# Patient Record
Sex: Male | Born: 1955 | Race: Black or African American | Hispanic: No | Marital: Married | Smoking: Never smoker
Health system: Southern US, Community
[De-identification: ages and names within clinical notes are randomized; demographics above are authoritative.]

## PROBLEM LIST (undated history)

## (undated) DIAGNOSIS — I10 Essential (primary) hypertension: Secondary | ICD-10-CM

## (undated) DIAGNOSIS — E119 Type 2 diabetes mellitus without complications: Secondary | ICD-10-CM

## (undated) DIAGNOSIS — J45909 Unspecified asthma, uncomplicated: Secondary | ICD-10-CM

---

## 2001-12-21 ENCOUNTER — Ambulatory Visit (HOSPITAL_COMMUNITY): Admission: RE | Admit: 2001-12-21 | Discharge: 2001-12-21 | Payer: Self-pay | Admitting: *Deleted

## 2001-12-21 ENCOUNTER — Encounter: Payer: Self-pay | Admitting: *Deleted

## 2003-06-01 ENCOUNTER — Ambulatory Visit (HOSPITAL_COMMUNITY): Admission: RE | Admit: 2003-06-01 | Discharge: 2003-06-01 | Payer: Self-pay | Admitting: Family Medicine

## 2003-06-12 ENCOUNTER — Ambulatory Visit (HOSPITAL_COMMUNITY): Admission: RE | Admit: 2003-06-12 | Discharge: 2003-06-12 | Payer: Self-pay | Admitting: Family Medicine

## 2006-08-03 ENCOUNTER — Encounter: Admission: RE | Admit: 2006-08-03 | Discharge: 2006-08-03 | Payer: Self-pay | Admitting: Family Medicine

## 2007-05-29 ENCOUNTER — Encounter: Admission: RE | Admit: 2007-05-29 | Discharge: 2007-05-29 | Payer: Self-pay | Admitting: Neurological Surgery

## 2008-01-02 ENCOUNTER — Emergency Department (HOSPITAL_COMMUNITY): Admission: EM | Admit: 2008-01-02 | Discharge: 2008-01-02 | Payer: Self-pay | Admitting: Emergency Medicine

## 2010-05-17 ENCOUNTER — Other Ambulatory Visit: Payer: Self-pay | Admitting: Family Medicine

## 2010-05-17 DIAGNOSIS — M545 Low back pain: Secondary | ICD-10-CM

## 2010-05-23 ENCOUNTER — Ambulatory Visit
Admission: RE | Admit: 2010-05-23 | Discharge: 2010-05-23 | Disposition: A | Discharge status: Home / Self Care | Payer: 59 | Source: Ambulatory Visit | Attending: Family Medicine | Admitting: Family Medicine

## 2010-05-23 DIAGNOSIS — M545 Low back pain: Secondary | ICD-10-CM

## 2010-10-18 LAB — RAPID URINE DRUG SCREEN, HOSP PERFORMED
Amphetamines: NOT DETECTED
Barbiturates: NOT DETECTED
Benzodiazepines: NOT DETECTED
Cocaine: NOT DETECTED
Opiates: NOT DETECTED

## 2016-04-18 DIAGNOSIS — D509 Iron deficiency anemia, unspecified: Secondary | ICD-10-CM | POA: Diagnosis not present

## 2016-04-18 DIAGNOSIS — E119 Type 2 diabetes mellitus without complications: Secondary | ICD-10-CM | POA: Diagnosis not present

## 2016-04-18 DIAGNOSIS — E7800 Pure hypercholesterolemia, unspecified: Secondary | ICD-10-CM | POA: Diagnosis not present

## 2016-04-18 DIAGNOSIS — I10 Essential (primary) hypertension: Secondary | ICD-10-CM | POA: Diagnosis not present

## 2016-04-18 DIAGNOSIS — Z125 Encounter for screening for malignant neoplasm of prostate: Secondary | ICD-10-CM | POA: Diagnosis not present

## 2016-04-18 DIAGNOSIS — Z0000 Encounter for general adult medical examination without abnormal findings: Secondary | ICD-10-CM | POA: Diagnosis not present

## 2016-05-23 DIAGNOSIS — D509 Iron deficiency anemia, unspecified: Secondary | ICD-10-CM | POA: Diagnosis not present

## 2016-07-02 DIAGNOSIS — Z86010 Personal history of colonic polyps: Secondary | ICD-10-CM | POA: Diagnosis not present

## 2016-07-02 DIAGNOSIS — D126 Benign neoplasm of colon, unspecified: Secondary | ICD-10-CM | POA: Diagnosis not present

## 2016-07-07 DIAGNOSIS — D509 Iron deficiency anemia, unspecified: Secondary | ICD-10-CM | POA: Diagnosis not present

## 2016-10-26 ENCOUNTER — Observation Stay (HOSPITAL_BASED_OUTPATIENT_CLINIC_OR_DEPARTMENT_OTHER)
Admission: EM | Admit: 2016-10-26 | Discharge: 2016-10-28 | Disposition: A | Discharge status: Home / Self Care | Payer: 59 | Attending: Internal Medicine | Admitting: Internal Medicine

## 2016-10-26 ENCOUNTER — Emergency Department (HOSPITAL_BASED_OUTPATIENT_CLINIC_OR_DEPARTMENT_OTHER): Payer: 59

## 2016-10-26 ENCOUNTER — Encounter (HOSPITAL_BASED_OUTPATIENT_CLINIC_OR_DEPARTMENT_OTHER): Payer: Self-pay | Admitting: Emergency Medicine

## 2016-10-26 DIAGNOSIS — R0902 Hypoxemia: Secondary | ICD-10-CM | POA: Diagnosis present

## 2016-10-26 DIAGNOSIS — R6510 Systemic inflammatory response syndrome (SIRS) of non-infectious origin without acute organ dysfunction: Secondary | ICD-10-CM | POA: Insufficient documentation

## 2016-10-26 DIAGNOSIS — I129 Hypertensive chronic kidney disease with stage 1 through stage 4 chronic kidney disease, or unspecified chronic kidney disease: Secondary | ICD-10-CM | POA: Insufficient documentation

## 2016-10-26 DIAGNOSIS — E1122 Type 2 diabetes mellitus with diabetic chronic kidney disease: Secondary | ICD-10-CM | POA: Diagnosis not present

## 2016-10-26 DIAGNOSIS — Z87891 Personal history of nicotine dependence: Secondary | ICD-10-CM | POA: Insufficient documentation

## 2016-10-26 DIAGNOSIS — R05 Cough: Secondary | ICD-10-CM | POA: Diagnosis not present

## 2016-10-26 DIAGNOSIS — Z23 Encounter for immunization: Secondary | ICD-10-CM | POA: Diagnosis not present

## 2016-10-26 DIAGNOSIS — E1169 Type 2 diabetes mellitus with other specified complication: Secondary | ICD-10-CM | POA: Diagnosis not present

## 2016-10-26 DIAGNOSIS — Z7982 Long term (current) use of aspirin: Secondary | ICD-10-CM | POA: Insufficient documentation

## 2016-10-26 DIAGNOSIS — R0602 Shortness of breath: Secondary | ICD-10-CM | POA: Diagnosis not present

## 2016-10-26 DIAGNOSIS — Z7984 Long term (current) use of oral hypoglycemic drugs: Secondary | ICD-10-CM | POA: Diagnosis not present

## 2016-10-26 DIAGNOSIS — I4581 Long QT syndrome: Secondary | ICD-10-CM | POA: Insufficient documentation

## 2016-10-26 DIAGNOSIS — Z79899 Other long term (current) drug therapy: Secondary | ICD-10-CM | POA: Insufficient documentation

## 2016-10-26 DIAGNOSIS — I252 Old myocardial infarction: Secondary | ICD-10-CM | POA: Diagnosis not present

## 2016-10-26 DIAGNOSIS — N182 Chronic kidney disease, stage 2 (mild): Secondary | ICD-10-CM | POA: Insufficient documentation

## 2016-10-26 DIAGNOSIS — I2510 Atherosclerotic heart disease of native coronary artery without angina pectoris: Secondary | ICD-10-CM | POA: Insufficient documentation

## 2016-10-26 DIAGNOSIS — I10 Essential (primary) hypertension: Secondary | ICD-10-CM | POA: Diagnosis not present

## 2016-10-26 DIAGNOSIS — R0600 Dyspnea, unspecified: Secondary | ICD-10-CM

## 2016-10-26 DIAGNOSIS — D72829 Elevated white blood cell count, unspecified: Secondary | ICD-10-CM | POA: Insufficient documentation

## 2016-10-26 HISTORY — DX: Essential (primary) hypertension: I10

## 2016-10-26 HISTORY — DX: Unspecified asthma, uncomplicated: J45.909

## 2016-10-26 HISTORY — DX: Type 2 diabetes mellitus without complications: E11.9

## 2016-10-26 LAB — INFLUENZA PANEL BY PCR (TYPE A & B)
Influenza A By PCR: NEGATIVE
Influenza B By PCR: NEGATIVE

## 2016-10-26 LAB — HEPATIC FUNCTION PANEL
ALBUMIN: 3.8 g/dL (ref 3.5–5.0)
ALT: 18 U/L (ref 17–63)
AST: 21 U/L (ref 15–41)
Alkaline Phosphatase: 56 U/L (ref 38–126)
BILIRUBIN DIRECT: 0.1 mg/dL (ref 0.1–0.5)
Indirect Bilirubin: 0.6 mg/dL (ref 0.3–0.9)
Total Bilirubin: 0.7 mg/dL (ref 0.3–1.2)
Total Protein: 7.6 g/dL (ref 6.5–8.1)

## 2016-10-26 LAB — CBC WITH DIFFERENTIAL/PLATELET
BASOS ABS: 0 10*3/uL (ref 0.0–0.1)
BASOS PCT: 0 %
EOS ABS: 0.3 10*3/uL (ref 0.0–0.7)
EOS PCT: 1 %
HEMATOCRIT: 37.6 % — AB (ref 39.0–52.0)
Hemoglobin: 12.2 g/dL — ABNORMAL LOW (ref 13.0–17.0)
LYMPHS PCT: 4 %
Lymphs Abs: 0.9 10*3/uL (ref 0.7–4.0)
MCH: 25.3 pg — ABNORMAL LOW (ref 26.0–34.0)
MCHC: 32.4 g/dL (ref 30.0–36.0)
MCV: 78 fL (ref 78.0–100.0)
MONO ABS: 1.2 10*3/uL — AB (ref 0.1–1.0)
Monocytes Relative: 5 %
Neutro Abs: 19.7 10*3/uL — ABNORMAL HIGH (ref 1.7–7.7)
Neutrophils Relative %: 90 %
Platelets: 269 10*3/uL (ref 150–400)
RBC: 4.82 MIL/uL (ref 4.22–5.81)
RDW: 14.9 % (ref 11.5–15.5)
WBC: 22.1 10*3/uL — AB (ref 4.0–10.5)

## 2016-10-26 LAB — CBC
HCT: 37.4 % — ABNORMAL LOW (ref 39.0–52.0)
Hemoglobin: 12.6 g/dL — ABNORMAL LOW (ref 13.0–17.0)
MCH: 25.9 pg — AB (ref 26.0–34.0)
MCHC: 33.7 g/dL (ref 30.0–36.0)
MCV: 77 fL — AB (ref 78.0–100.0)
PLATELETS: 285 10*3/uL (ref 150–400)
RBC: 4.86 MIL/uL (ref 4.22–5.81)
RDW: 14.6 % (ref 11.5–15.5)
WBC: 27.3 10*3/uL — ABNORMAL HIGH (ref 4.0–10.5)

## 2016-10-26 LAB — BASIC METABOLIC PANEL
Anion gap: 8 (ref 5–15)
BUN: 19 mg/dL (ref 6–20)
CALCIUM: 8.9 mg/dL (ref 8.9–10.3)
CO2: 24 mmol/L (ref 22–32)
Chloride: 104 mmol/L (ref 101–111)
Creatinine, Ser: 1.54 mg/dL — ABNORMAL HIGH (ref 0.61–1.24)
GFR calc Af Amer: 55 mL/min — ABNORMAL LOW (ref >60)
GFR, EST NON AFRICAN AMERICAN: 48 mL/min — AB (ref >60)
GLUCOSE: 142 mg/dL — AB (ref 65–99)
POTASSIUM: 4.2 mmol/L (ref 3.5–5.1)
SODIUM: 136 mmol/L (ref 135–145)

## 2016-10-26 LAB — TROPONIN I

## 2016-10-26 LAB — URINALYSIS, ROUTINE W REFLEX MICROSCOPIC
Bilirubin Urine: NEGATIVE
GLUCOSE, UA: NEGATIVE mg/dL
Hgb urine dipstick: NEGATIVE
Ketones, ur: NEGATIVE mg/dL
Leukocytes, UA: NEGATIVE
Nitrite: NEGATIVE
PH: 6 (ref 5.0–8.0)
PROTEIN: NEGATIVE mg/dL
Specific Gravity, Urine: 1.015 (ref 1.005–1.030)

## 2016-10-26 LAB — I-STAT CG4 LACTIC ACID, ED: Lactic Acid, Venous: 1.68 mmol/L (ref 0.5–1.9)

## 2016-10-26 LAB — BRAIN NATRIURETIC PEPTIDE: B NATRIURETIC PEPTIDE 5: 38.6 pg/mL (ref 0.0–100.0)

## 2016-10-26 LAB — CREATININE, SERUM
Creatinine, Ser: 1.51 mg/dL — ABNORMAL HIGH (ref 0.61–1.24)
GFR calc Af Amer: 57 mL/min — ABNORMAL LOW (ref >60)
GFR calc non Af Amer: 49 mL/min — ABNORMAL LOW (ref >60)

## 2016-10-26 MED ORDER — HYDROCHLOROTHIAZIDE 12.5 MG PO CAPS
12.5000 mg | ORAL_CAPSULE | Freq: Every day | ORAL | Status: DC
  Administered 2016-10-27 – 2016-10-28 (×2): 12.5 mg via ORAL
  Filled 2016-10-26 (×2): qty 1

## 2016-10-26 MED ORDER — ENOXAPARIN SODIUM 40 MG/0.4ML ~~LOC~~ SOLN
40.0000 mg | SUBCUTANEOUS | Status: DC
  Administered 2016-10-26 – 2016-10-27 (×2): 40 mg via SUBCUTANEOUS
  Filled 2016-10-26 (×2): qty 0.4

## 2016-10-26 MED ORDER — PIPERACILLIN-TAZOBACTAM 3.375 G IVPB 30 MIN
3.3750 g | Freq: Once | INTRAVENOUS | Status: AC
  Administered 2016-10-26: 3.375 g via INTRAVENOUS
  Filled 2016-10-26 (×2): qty 50

## 2016-10-26 MED ORDER — PRAVASTATIN SODIUM 40 MG PO TABS
40.0000 mg | ORAL_TABLET | Freq: Every day | ORAL | Status: DC
  Administered 2016-10-26 – 2016-10-27 (×2): 40 mg via ORAL
  Filled 2016-10-26 (×2): qty 1

## 2016-10-26 MED ORDER — PIPERACILLIN-TAZOBACTAM 3.375 G IVPB: 3.3750 g | Freq: Three times a day (TID) | INTRAVENOUS | Status: DC

## 2016-10-26 MED ORDER — FUROSEMIDE 10 MG/ML IJ SOLN
20.0000 mg | Freq: Once | INTRAMUSCULAR | Status: AC
  Administered 2016-10-26: 20 mg via INTRAVENOUS
  Filled 2016-10-26: qty 2

## 2016-10-26 MED ORDER — ASPIRIN EC 81 MG PO TBEC
81.0000 mg | DELAYED_RELEASE_TABLET | Freq: Every day | ORAL | Status: DC
  Administered 2016-10-27 – 2016-10-28 (×2): 81 mg via ORAL
  Filled 2016-10-26 (×2): qty 1

## 2016-10-26 MED ORDER — VANCOMYCIN HCL IN DEXTROSE 750-5 MG/150ML-% IV SOLN
750.0000 mg | Freq: Two times a day (BID) | INTRAVENOUS | Status: DC
  Filled 2016-10-26: qty 150

## 2016-10-26 MED ORDER — ACETAMINOPHEN 500 MG PO TABS
1000.0000 mg | ORAL_TABLET | Freq: Once | ORAL | Status: AC
  Administered 2016-10-26: 1000 mg via ORAL
  Filled 2016-10-26: qty 2

## 2016-10-26 MED ORDER — LOSARTAN POTASSIUM 50 MG PO TABS
100.0000 mg | ORAL_TABLET | Freq: Every day | ORAL | Status: DC
  Administered 2016-10-27 – 2016-10-28 (×2): 100 mg via ORAL
  Filled 2016-10-26 (×2): qty 2

## 2016-10-26 MED ORDER — LOSARTAN POTASSIUM-HCTZ 100-12.5 MG PO TABS: 1.0000 | ORAL_TABLET | Freq: Every day | ORAL | Status: DC

## 2016-10-26 MED ORDER — VANCOMYCIN HCL IN DEXTROSE 1-5 GM/200ML-% IV SOLN
1000.0000 mg | Freq: Once | INTRAVENOUS | Status: AC
  Administered 2016-10-26: 1000 mg via INTRAVENOUS
  Filled 2016-10-26: qty 200

## 2016-10-26 MED ORDER — IOPAMIDOL (ISOVUE-370) INJECTION 76%
100.0000 mL | Freq: Once | INTRAVENOUS | Status: AC | PRN
  Administered 2016-10-26: 100 mL via INTRAVENOUS

## 2016-10-26 NOTE — H&P (Signed)
History and Physical    Philip Reyes OJJ:[id number] DOB: 05-Jun-1955 DOA: 10/26/2016  PCP: System, Provider Not In  Patient coming from: home  I have personally briefly reviewed patient's old medical records in [place]  Chief Complaint: DOE, cough, SOB  HPI: Philip Reyes is a 61 y.o. male with medical history significant of asthma, diabetes, hypertension who is presenting with worsening shortness of breath. He notes that he's had shortness of breath with exertion for about the past 2 weeks. This morning the shortness of breath was worse. He noticed cough productive of phlegm, no blood. As well as shortness of breath. He noted an elevated temperature ( but no true fever).  He denies any nausea, vomiting, chills, sore throat, paroxysmal nocturnal dyspnea, orthopnea, lower extremity swelling.  Travel to [place] in January.  URI in someone he worked with.  ED Course: Lasix, abx, CXR, CTA, EKG.  Plan to admit given desaturation with ambulation.  Review of Systems: As per HPI otherwise 10 point review of systems negative.   Past Medical History:  Diagnosis Date  . Asthma   . Diabetes mellitus without complication ([place])   . Hypertension     History reviewed. No pertinent surgical history.   reports that he has quit smoking. He has never used smokeless tobacco. He reports that he drinks alcohol. He reports that he does not use drugs.  No Known Allergies  No family history on file.   Prior to Admission medications   Medication Sig Start Date End Date Taking? Authorizing Provider  aspirin EC 81 MG tablet Take 81 mg by mouth daily.   Yes [provider]  glipiZIDE (GLUCOTROL XL) 10 MG 24 hr tablet Take 10 mg by mouth every evening. 09/24/16  Yes [provider]  guaiFENesin (MUCINEX) 600 MG 12 hr tablet Take 1,200 mg by mouth 2 (two) times daily.   Yes [provider]  losartan-hydrochlorothiazide (HYZAAR) 100-12.5 MG tablet Take 1 tablet by mouth  daily.   Yes [provider]  pravastatin (PRAVACHOL) 40 MG tablet Take 40 mg by mouth at bedtime.   Yes [provider]  Saxagliptin-Metformin (KOMBIGLYZE XR) 2.05-998 MG TB24 Take 2 tablets by mouth every evening.    Yes [provider]    Physical Exam: Vitals:   10/26/16 1400 10/26/16 1422 10/26/16 1500 10/26/16 1712  BP: 122/76  95/79 128/72  Pulse: 64 67 63 65  Resp: 20 (!) 26 (!) 22 20  Temp:    98.3 F (36.8 C)  TempSrc:    Oral  SpO2: 96% 96% 96% 95%  Weight:    92 kg (202 lb 13.2 oz)  Height:    6\' 2"  (1.88 m)    Constitutional: NAD, calm, comfortable Vitals:   10/26/16 1400 10/26/16 1422 10/26/16 1500 10/26/16 1712  BP: 122/76  95/79 128/72  Pulse: 64 67 63 65  Resp: 20 (!) 26 (!) 22 20  Temp:    98.3 F (36.8 C)  TempSrc:    Oral  SpO2: 96% 96% 96% 95%  Weight:    92 kg (202 lb 13.2 oz)  Height:    6\' 2"  (1.88 m)   Eyes: PERRL, lids and conjunctivae normal ENMT: Mucous membranes are moist. Posterior pharynx clear of any exudate or lesions.Normal dentition.  Neck: normal, supple, no masses, no thyromegaly Respiratory: clear to auscultation bilaterally, no wheezing, no crackles. Normal respiratory effort. No accessory muscle use.  Cardiovascular: Regular rate and rhythm, no murmurs /  rubs / gallops. No extremity edema. 2+ pedal pulses. No carotid bruits.  Abdomen: no tenderness, no masses palpated. No hepatosplenomegaly. Bowel sounds positive.  Musculoskeletal: no clubbing / cyanosis. No joint deformity upper and lower extremities. Good ROM, no contractures. Normal muscle tone.  Skin: no rashes, lesions, ulcers. No induration Neurologic: CN 2-12 grossly intact. Sensation intact, DTR normal. Strength 5/5 in all 4.  Psychiatric: Normal judgment and insight. Alert and oriented x 3. Normal mood.   Labs on Admission: I have personally reviewed following labs and imaging studies  CBC:  Recent Labs Lab 10/26/16 0924 10/26/16 1707  WBC  22.1* 27.3*  NEUTROABS 19.7*  --   HGB 12.2* 12.6*  HCT 37.6* 37.4*  MCV 78.0 77.0*  PLT 269 528   Basic Metabolic Panel:  Recent Labs Lab 10/26/16 0924 10/26/16 1707  NA 136  --   K 4.2  --   CL 104  --   CO2 24  --   GLUCOSE 142*  --   BUN 19  --   CREATININE 1.54* 1.51*  CALCIUM 8.9  --    GFR: Estimated Creatinine Clearance: 61.2 mL/min (A) (by C-G formula based on SCr of 1.51 mg/dL (H)). Liver Function Tests:  Recent Labs Lab 10/26/16 0924  AST 21  ALT 18  ALKPHOS 56  BILITOT 0.7  PROT 7.6  ALBUMIN 3.8   No results for input(s): LIPASE, AMYLASE in the last 168 hours. No results for input(s): AMMONIA in the last 168 hours. Coagulation Profile: No results for input(s): INR, PROTIME in the last 168 hours. Cardiac Enzymes:  Recent Labs Lab 10/26/16 0924  TROPONINI <0.03   BNP (last 3 results) No results for input(s): PROBNP in the last 8760 hours. HbA1C: No results for input(s): HGBA1C in the last 72 hours. CBG: No results for input(s): GLUCAP in the last 168 hours. Lipid Profile: No results for input(s): CHOL, HDL, LDLCALC, TRIG, CHOLHDL, LDLDIRECT in the last 72 hours. Thyroid Function Tests: No results for input(s): TSH, T4TOTAL, FREET4, T3FREE, THYROIDAB in the last 72 hours. Anemia Panel: No results for input(s): VITAMINB12, FOLATE, FERRITIN, TIBC, IRON, RETICCTPCT in the last 72 hours. Urine analysis:    Component Value Date/Time   COLORURINE YELLOW 10/26/2016 [place] 10/26/2016 1158   LABSPEC 1.015 10/26/2016 1158   PHURINE 6.0 10/26/2016 1158   GLUCOSEU NEGATIVE 10/26/2016 1158   HGBUR NEGATIVE 10/26/2016 1158   BILIRUBINUR NEGATIVE 10/26/2016 1158   KETONESUR NEGATIVE 10/26/2016 1158   PROTEINUR NEGATIVE 10/26/2016 1158   NITRITE NEGATIVE 10/26/2016 1158   LEUKOCYTESUR NEGATIVE 10/26/2016 1158    Radiological Exams on Admission: Dg Chest 2 View  Result Date: 10/26/2016 CLINICAL DATA:  Cough and congestion for 2  weeks EXAM: CHEST  2 VIEW COMPARISON:  None available FINDINGS: Normal heart size. Masslike density at the level of the hila on the lateral projection that is not seen frontally. There is no edema, consolidation, effusion, or pneumothorax. No acute osseous finding. IMPRESSION: Masslike appearance overlapping the hila in the lateral projection that is not seen frontally. Recommend chest CT in this former smoker. Electronically Signed   By: Monte Fantasia M.D.   On: 10/26/2016 09:28   Ct Angio Chest Pe W And/or Wo Contrast  Result Date: 10/26/2016 CLINICAL DATA:  Shortness of breath, cough and leukocytosis. Lateral chest x-ray demonstrated potential hilar/perihilar mass-like density. EXAM: CT ANGIOGRAPHY CHEST WITH CONTRAST TECHNIQUE: Multidetector CT imaging of the chest was performed using the standard protocol during bolus  administration of intravenous contrast. Multiplanar CT image reconstructions and MIPs were obtained to evaluate the vascular anatomy. CONTRAST:  100 mL Isovue 370 IV COMPARISON:  Chest x-ray earlier today. FINDINGS: Cardiovascular: Opacification of the pulmonary artery use is somewhat suboptimal at the peripheral level. However, significant pulmonary embolism can be excluded. The thoracic aorta is normal in caliber and shows no evidence of significant atherosclerosis, aneurysm or dissection. Proximal great vessels are normally patent and show normal branching anatomy. There is evidence of coronary atherosclerosis with calcified plaque present at the level of the left main coronary artery, LAD and left circumflex coronary artery. No pericardial fluid. The heart size is normal. Somewhat thin appearance of the lateral left ventricular myocardium and distal apex may be consistent with prior myocardial infarction. Mediastinum/Nodes: No enlarged mediastinal, hilar, or axillary lymph nodes. Thyroid gland, trachea, and esophagus demonstrate no significant findings. Lungs/Pleura: There is no  evidence of pulmonary edema, consolidation, pneumothorax, nodule or pleural fluid. Upper Abdomen: No acute abnormality. Musculoskeletal: No chest wall abnormality. No acute or significant osseous findings. Review of the MIP images confirms the above findings. IMPRESSION: 1. No evidence of pulmonary embolism or other acute pulmonary process. 2. Coronary atherosclerosis with calcified plaque at the level of the left main, LAD and left circumflex territories. Associated suggestion by CT of lateral myocardial and distal apical thinning which may be consistent with prior infarction. Correlation suggested with any known prior cardiac history. 3. No pulmonary masses or lymphadenopathy. Electronically Signed   By: Aletta Edouard M.D.   On: 10/26/2016 10:44    EKG: Independently reviewed. No priors for comparison.  Sinus rhythm. Q waves in aVR, aVL, V1, V2.  Prolonged QT.   Assessment/Plan Active Problems:   Hypoxia  Systemic inflammatory response syndrome  Leukocytosis:  With tachypnea and elevated white blood cell count in the emergency department.elevated white blood cell count suggests infection, but chest imaging not revealing of this.  received vancomycin and Zosyn in the ED.  Possibly viral, but impressive white blood cell count. Urine not suggestive of infection. [ ]  RVP, influenza [ ]  blood cultures. Will hold off on continued antibiotics at this point without a clear source   [ ]  daily CBC  Cough  SOB with exertion:  Symptoms sound like a respiratory infection with cough, shortness of breath, elevated white blood cell count. Imaging is unrevealing.  Negative proBNP and troponin.  He's been on room air since presentation, but was tachypneic in the ED and desaturated with ambulation in the emergency department so he was transferred here.  CTA showed no evidence of PE or acute pulmonary process (it did show evidence of possible prior MI).  He's had sick contact in coworker, travel to [place] in Jan.   At the time of my evaluation he was feeling better and breathing comfortable.  [ ]  RVP, influenza [ ]  echo [ ]  6 min walk for tomorrow Holding abx for now [ ]  case discussed with pulm by ED who per Dr. Keturah Shavers note will "c/s when he is inpatient after additional workup"  Coronary atherosclerosis and suggestion of lateral myocardial and distal apical thickening on CT:  No CP.  [ ]  echo  CKD stage IV: unclear baseline, ctm T2DM: hold oral meds.  SSI.  HTN: continue home meds  DVT prophylaxis: lovenox  Code Status: full  Family Communication: family in room Disposition Plan: home Consults called: pulmonology by ED  Admission status: telemetry    Fayrene Helper MD [facility] [phone]  If 7PM-7AM, please contact night-coverage [email or web address] Password [facility]  10/26/2016, 8:42 PM

## 2016-10-26 NOTE — ED Triage Notes (Signed)
Cough, body aches x 1 week. SOB when laying down.

## 2016-10-26 NOTE — ED Notes (Signed)
Patient transported to CT 

## 2016-10-26 NOTE — ED Notes (Signed)
Resting HR 78, RR 34, SpO2 96%.  After ambulating about 50 ft here in the ED  HR increased to 111, RR remained about the same and SpO2 decreased to 87%. Pt states "I dont feel any different."   Sp02 increased to 97% and we made another lap here in the ED. HR increased to 115, RR remained the same. SpO2 decreased to 87%. PA made aware.

## 2016-10-26 NOTE — ED Notes (Signed)
Pt on cardiac monitor and auto VS 

## 2016-10-26 NOTE — ED Notes (Signed)
Attempted to call report x1, advised nurse was at lunch.

## 2016-10-26 NOTE — ED Notes (Signed)
Patient transported to X-ray 

## 2016-10-26 NOTE — ED Provider Notes (Signed)
[facility] Provider Note   CSN: [id number] Arrival date & time: 10/26/16  0900     History   Chief Complaint Chief Complaint  Patient presents with  . Cough    HPI Philip Reyes is a 61 y.o. male with a pmh of DM, Asthma, HTN, and former smoker. He presents wit the ED with cc of SOB . Onset 1 week ago. Patient states that he is dyspneic at rest and with exertion. He feeling more short of breath but denies any wheezing, fevers or chills. Last night his breathing became very labored especially when trying to lying flat. He denies a history of CHF, any recent chest pain. He has noticed a cough which is now painful. He also felt febrile this morning with body aches, chills. He states that his blood sugars have been running between 80 and 110 and his last hemoglobin A1c was 6.2.  HPI  Past Medical History:  Diagnosis Date  . Asthma   . Diabetes mellitus without complication ([place])   . Hypertension     There are no active problems to display for this patient.   History reviewed. No pertinent surgical history.     Home Medications    Prior to Admission medications   Medication Sig Start Date End Date Taking? Authorizing Provider  glipiZIDE (GLUCOTROL) 10 MG tablet Take 10 mg by mouth daily before breakfast.   Yes [provider]  losartan-hydrochlorothiazide (HYZAAR) 100-12.5 MG tablet Take 1 tablet by mouth daily.   Yes [provider]  Saxagliptin-Metformin (KOMBIGLYZE XR) 2.05-998 MG TB24 Take by mouth.   Yes [provider]    Family History No family history on file.  Social History Social History  Substance Use Topics  . Smoking status: Former Research scientist (life sciences)  . Smokeless tobacco: Never Used  . Alcohol use Yes     Allergies   Patient has no known allergies.   Review of Systems Review of Systems  Ten systems reviewed and are negative for acute change, except as noted in the HPI.  Physical Exam Updated Vital Signs BP 140/89  (BP Location: Right Arm)   Pulse 87   Temp 99.4 F (37.4 C) (Oral)   Resp (!) 28   Ht 6\' 3"  (1.905 m)   Wt 95.3 kg (210 lb)   SpO2 95%   BMI 26.25 kg/m   Physical Exam  Constitutional: He appears well-developed and well-nourished. No distress.  HENT:  Head: Normocephalic and atraumatic.  Eyes: Conjunctivae are normal. No scleral icterus.  Neck: Normal range of motion. Neck supple.  Cardiovascular: Normal rate, regular rhythm, normal heart sounds and intact distal pulses.   Trace pitting edema on bilateral legs.  Pulmonary/Chest: Effort normal and breath sounds normal. No respiratory distress.  Tachypnea, course crackles, worse in the lung bases  Abdominal: Soft. He exhibits no distension. There is no tenderness.  Musculoskeletal: He exhibits no edema.  Neurological: He is alert.  Skin: Skin is warm and dry. He is not diaphoretic.  Psychiatric: He has a normal mood and affect. His behavior is normal.  Nursing note and vitals reviewed.    ED Treatments / Results  Labs (all labs ordered are listed, but only abnormal results are displayed) Labs Reviewed  BASIC METABOLIC PANEL  CBC WITH DIFFERENTIAL/PLATELET  BRAIN NATRIURETIC PEPTIDE  TROPONIN I    EKG  EKG Interpretation None       Radiology Dg Chest 2 View  Result Date: 10/26/2016 CLINICAL DATA:  Cough and congestion  for 2 weeks EXAM: CHEST  2 VIEW COMPARISON:  None available FINDINGS: Normal heart size. Masslike density at the level of the hila on the lateral projection that is not seen frontally. There is no edema, consolidation, effusion, or pneumothorax. No acute osseous finding. IMPRESSION: Masslike appearance overlapping the hila in the lateral projection that is not seen frontally. Recommend chest CT in this former smoker. Electronically Signed   By: Monte Fantasia M.D.   On: 10/26/2016 09:28    Procedures Procedures (including critical care time)  Medications Ordered in ED Medications  acetaminophen  (TYLENOL) tablet 1,000 mg (not administered)     Initial Impression / Assessment and Plan / ED Course  I have reviewed the triage vital signs and the nursing notes.  Pertinent labs & imaging results that were available during my care of the patient were reviewed by me and considered in my medical decision making (see chart for details).  Clinical Course as of Oct 27 1651  Sun Oct 26, 2016  1348 Patient with exertional hypoxia, orthopnea, CAD on CT scan.   [AH]    Clinical Course User Index [AH] Margarita Mail, PA-C    Patient with exertional hypoxia, persistent tachypnea who presented with new onset dyspnea on exertion and orthopnea. His chest x-ray showed mass on the lateral film however this appears to be artifact. No evidence of PE or masses on the patient's CT angiogram. No evidence of fluid in the lungs. Dr. Kathlene Cote and I discussed these findings. The patient pears to have significant atherosclerotic coronary arteriesinto any of the apical wall suggestive of previous MIs. 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 Given his exertional hypoxia, and difficulty breathing for the patient will need admission with further workup to include echocardiogram. I believe the etiology is likely cardiac and not pulmonary Differential diagnosis does include idiopathic pulmonary hypertension, cor pulmonale, CHF is less likely given the fact that there is no pulmonary edema. No evidence of pulmonary embolus, masses. No active chest pain or elevation in troponin or EKG changes suggestive of ACS. The patient will be admitted to the hospital for further workup. He is stable throughout his visit. Seen in shared visit with Dr. Oleta Mouse who agrees with workup and plan for admission.  Final Clinical Impressions(s) / ED Diagnoses   Final diagnoses:  Dyspnea, unspecified type  Hypoxia  Coronary artery disease involving native heart without angina pectoris, unspecified vessel or lesion type    New  Prescriptions New Prescriptions   No medications on file     Margarita Mail, PA-C 10/26/16 1658    Forde Dandy, MD 10/26/16 1714

## 2016-10-26 NOTE — Progress Notes (Signed)
Pharmacy Antibiotic Note  Philip Reyes is a 62 y.o. male admitted on 10/26/2016 with sepsis.  Pharmacy has been consulted for Zosyn and vancomycin dosing.  Given one time doses by ED provider. SCr 1.54, CrCl ~9ml/min  Plan: Continue Zosyn 3.375 gm IV q8h (4 hour infusion) Start vancomycin 750mg  IV Q12h Monitor clinical picture, renal function, VT prn F/U C&S, abx deescalation / LOT   Height: 6\' 3"  (190.5 cm) Weight: 210 lb (95.3 kg) IBW/kg (Calculated) : 84.5  Temp (24hrs), Avg:99.4 F (37.4 C), Min:99.4 F (37.4 C), Max:99.4 F (37.4 C)   Recent Labs Lab 10/26/16 0924  WBC 22.1*    CrCl cannot be calculated (No order found.).    No Known Allergies  Thank you for allowing pharmacy to be a part of this patient's care.  Reginia Naas 10/26/2016 9:52 AM

## 2016-10-27 ENCOUNTER — Observation Stay (HOSPITAL_BASED_OUTPATIENT_CLINIC_OR_DEPARTMENT_OTHER): Payer: 59

## 2016-10-27 DIAGNOSIS — I361 Nonrheumatic tricuspid (valve) insufficiency: Secondary | ICD-10-CM | POA: Diagnosis not present

## 2016-10-27 DIAGNOSIS — R0902 Hypoxemia: Secondary | ICD-10-CM | POA: Diagnosis not present

## 2016-10-27 DIAGNOSIS — I2510 Atherosclerotic heart disease of native coronary artery without angina pectoris: Secondary | ICD-10-CM

## 2016-10-27 DIAGNOSIS — R0600 Dyspnea, unspecified: Secondary | ICD-10-CM | POA: Diagnosis not present

## 2016-10-27 LAB — CBC WITH DIFFERENTIAL/PLATELET
BASOS ABS: 0 10*3/uL (ref 0.0–0.1)
BASOS PCT: 0 %
Eosinophils Absolute: 0.6 10*3/uL (ref 0.0–0.7)
Eosinophils Relative: 3 %
HEMATOCRIT: 37.7 % — AB (ref 39.0–52.0)
HEMOGLOBIN: 12.5 g/dL — AB (ref 13.0–17.0)
LYMPHS ABS: 2 10*3/uL (ref 0.7–4.0)
LYMPHS PCT: 11 %
MCH: 25.7 pg — ABNORMAL LOW (ref 26.0–34.0)
MCHC: 33.2 g/dL (ref 30.0–36.0)
MCV: 77.6 fL — AB (ref 78.0–100.0)
MONO ABS: 0.6 10*3/uL (ref 0.1–1.0)
Monocytes Relative: 3 %
Neutro Abs: 15.4 10*3/uL — ABNORMAL HIGH (ref 1.7–7.7)
Neutrophils Relative %: 83 %
Platelets: 276 10*3/uL (ref 150–400)
RBC: 4.86 MIL/uL (ref 4.22–5.81)
RDW: 14.6 % (ref 11.5–15.5)
WBC: 18.5 10*3/uL — ABNORMAL HIGH (ref 4.0–10.5)

## 2016-10-27 LAB — RESPIRATORY PANEL BY PCR

## 2016-10-27 LAB — BASIC METABOLIC PANEL
ANION GAP: 11 (ref 5–15)
BUN: 18 mg/dL (ref 6–20)
CHLORIDE: 100 mmol/L — AB (ref 101–111)
CO2: 26 mmol/L (ref 22–32)
Calcium: 9 mg/dL (ref 8.9–10.3)
Creatinine, Ser: 1.47 mg/dL — ABNORMAL HIGH (ref 0.61–1.24)
GFR calc non Af Amer: 50 mL/min — ABNORMAL LOW (ref >60)
GFR, EST AFRICAN AMERICAN: 59 mL/min — AB (ref >60)
Glucose, Bld: 150 mg/dL — ABNORMAL HIGH (ref 65–99)
POTASSIUM: 3.8 mmol/L (ref 3.5–5.1)
Sodium: 137 mmol/L (ref 135–145)

## 2016-10-27 LAB — HIV ANTIBODY (ROUTINE TESTING W REFLEX): HIV SCREEN 4TH GENERATION: NONREACTIVE

## 2016-10-27 LAB — ECHOCARDIOGRAM COMPLETE
Height: 74 in
Weight: 3245.17 oz

## 2016-10-27 MED ORDER — FUROSEMIDE 10 MG/ML IJ SOLN
40.0000 mg | Freq: Once | INTRAMUSCULAR | Status: AC
Start: 2016-10-27 — End: 2016-10-27
  Administered 2016-10-27: 40 mg via INTRAVENOUS
  Filled 2016-10-27: qty 4

## 2016-10-27 MED ORDER — INFLUENZA VAC SPLIT QUAD 0.5 ML IM SUSY
0.5000 mL | PREFILLED_SYRINGE | INTRAMUSCULAR | Status: AC
Start: 2016-10-28 — End: 2016-10-28
  Administered 2016-10-28: 0.5 mL via INTRAMUSCULAR
  Filled 2016-10-27: qty 0.5

## 2016-10-27 NOTE — Progress Notes (Signed)
PROGRESS NOTE    THOMA PAULSEN  CHY:[id number] DOB: 1955/01/22 DOA: 10/26/2016 PCP: System, Provider Not In    Brief Narrative: Philip Reyes is a 61 y.o. male with medical history significant of asthma, diabetes, hypertension who is presenting with worsening shortness of breath. He is being admitted for evaluation of SOB.   Assessment & Plan:   Active Problems:   Hypoxia   Cough and SOB:  Probably viral in nature.  Improved.  Echocardiogram reviewed and does not show HF.  CTA does not show evidence of PE.  One dose of lasix ordered and his sob improved.     SIRS:  LEUKOCYTOSIS:  Probably viral in nature.  resp panel is negative.  Holding antibiotics for now.       DVT prophylaxis: LOVENOX.  Code Status: full code.  Family Communication: none at bedside.  Disposition Plan: possibly home in am.   Consultants:   None.   Procedures: echocardiogram.  Antimicrobials: none.   Subjective: No new complaints.   Objective: Vitals:   10/26/16 1712 10/26/16 2101 10/27/16 0451 10/27/16 1401  BP: 128/72 135/73 121/68 126/79  Pulse: 65 66 62 (!) 57  Resp: 20 20 20 18   Temp: 98.3 F (36.8 C) 98.5 F (36.9 C) 98.4 F (36.9 C) 98.9 F (37.2 C)  TempSrc: Oral Oral Oral Oral  SpO2: 95% 98% 93% 99%  Weight: 92 kg (202 lb 13.2 oz)     Height: 6\' 2"  (1.88 m)       Intake/Output Summary (Last 24 hours) at 10/27/16 1717 Last data filed at 10/27/16 1400  Gross per 24 hour  Intake              240 ml  Output                0 ml  Net              240 ml   Filed Weights   10/26/16 0906 10/26/16 1712  Weight: 95.3 kg (210 lb) 92 kg (202 lb 13.2 oz)    Examination:  General exam: Appears calm and comfortable  Respiratory system: Clear to auscultation. Respiratory effort normal. Cardiovascular system: S1 & S2 heard, RRR. No JVD, murmurs, rubs, gallops or clicks. No pedal edema. Gastrointestinal system: Abdomen is nondistended, soft and nontender. No organomegaly or  masses felt. Normal bowel sounds heard. Central nervous system: Alert and oriented. No focal neurological deficits. Extremities: Symmetric 5 x 5 power. Skin: No rashes, lesions or ulcers Psychiatry: Judgement and insight appear normal. Mood & affect appropriate.     Data Reviewed: I have personally reviewed following labs and imaging studies  CBC:  Recent Labs Lab 10/26/16 0924 10/26/16 1707 10/27/16 0535  WBC 22.1* 27.3* 18.5*  NEUTROABS 19.7*  --  15.4*  HGB 12.2* 12.6* 12.5*  HCT 37.6* 37.4* 37.7*  MCV 78.0 77.0* 77.6*  PLT 269 285 878   Basic Metabolic Panel:  Recent Labs Lab 10/26/16 0924 10/26/16 1707 10/27/16 0535  NA 136  --  137  K 4.2  --  3.8  CL 104  --  100*  CO2 24  --  26  GLUCOSE 142*  --  150*  BUN 19  --  18  CREATININE 1.54* 1.51* 1.47*  CALCIUM 8.9  --  9.0   GFR: Estimated Creatinine Clearance: 62.9 mL/min (A) (by C-G formula based on SCr of 1.47 mg/dL (H)). Liver Function Tests:  Recent Labs Lab 10/26/16 0924  AST 21  ALT 18  ALKPHOS 56  BILITOT 0.7  PROT 7.6  ALBUMIN 3.8   No results for input(s): LIPASE, AMYLASE in the last 168 hours. No results for input(s): AMMONIA in the last 168 hours. Coagulation Profile: No results for input(s): INR, PROTIME in the last 168 hours. Cardiac Enzymes:  Recent Labs Lab 10/26/16 0924  TROPONINI <0.03   BNP (last 3 results) No results for input(s): PROBNP in the last 8760 hours. HbA1C: No results for input(s): HGBA1C in the last 72 hours. CBG: No results for input(s): GLUCAP in the last 168 hours. Lipid Profile: No results for input(s): CHOL, HDL, LDLCALC, TRIG, CHOLHDL, LDLDIRECT in the last 72 hours. Thyroid Function Tests: No results for input(s): TSH, T4TOTAL, FREET4, T3FREE, THYROIDAB in the last 72 hours. Anemia Panel: No results for input(s): VITAMINB12, FOLATE, FERRITIN, TIBC, IRON, RETICCTPCT in the last 72 hours. Sepsis Labs:  Recent Labs Lab 10/26/16 1003  LATICACIDVEN  1.68    Recent Results (from the past 240 hour(s))  Blood Culture (routine x 2)     Status: None (Preliminary result)   Collection Time: 10/26/16  9:55 AM  Result Value Ref Range Status   Specimen Description BLOOD RIGHT ARM  Final   Special Requests   Final    BOTTLES DRAWN AEROBIC AND ANAEROBIC Blood Culture adequate volume   Culture   Final    NO GROWTH < 24 HOURS Performed at [facility], [place] [address], [place], [place] [address]    Report Status PENDING  Incomplete  Blood Culture (routine x 2)     Status: None (Preliminary result)   Collection Time: 10/26/16  9:57 AM  Result Value Ref Range Status   Specimen Description BLOOD LEFT HAND  Final   Special Requests   Final    BOTTLES DRAWN AEROBIC AND ANAEROBIC Blood Culture adequate volume   Culture   Final    NO GROWTH < 24 HOURS Performed at [facility], [place] [address], [place], [place] [address]    Report Status PENDING  Incomplete  Respiratory Panel by PCR     Status: None   Collection Time: 10/26/16  5:44 PM  Result Value Ref Range Status   Adenovirus NOT DETECTED NOT DETECTED Final   Coronavirus 229E NOT DETECTED NOT DETECTED Final   Coronavirus HKU1 NOT DETECTED NOT DETECTED Final   Coronavirus NL63 NOT DETECTED NOT DETECTED Final   Coronavirus OC43 NOT DETECTED NOT DETECTED Final   Metapneumovirus NOT DETECTED NOT DETECTED Final   Rhinovirus / Enterovirus NOT DETECTED NOT DETECTED Final   Influenza A NOT DETECTED NOT DETECTED Final   Influenza B NOT DETECTED NOT DETECTED Final   Parainfluenza Virus 1 NOT DETECTED NOT DETECTED Final   Parainfluenza Virus 2 NOT DETECTED NOT DETECTED Final   Parainfluenza Virus 3 NOT DETECTED NOT DETECTED Final   Parainfluenza Virus 4 NOT DETECTED NOT DETECTED Final   Respiratory Syncytial Virus NOT DETECTED NOT DETECTED Final   Bordetella pertussis NOT DETECTED NOT DETECTED Final   Chlamydophila pneumoniae NOT DETECTED NOT DETECTED Final   Mycoplasma pneumoniae NOT  DETECTED NOT DETECTED Final    Comment: Performed at [facility], [place] [address] [place], [place] [address]         Radiology Studies: Dg Chest 2 View  Result Date: 10/26/2016 CLINICAL DATA:  Cough and congestion for 2 weeks EXAM: CHEST  2 VIEW COMPARISON:  None available FINDINGS: Normal heart size. Masslike density at the level of  the hila on the lateral projection that is not seen frontally. There is no edema, consolidation, effusion, or pneumothorax. No acute osseous finding. IMPRESSION: Masslike appearance overlapping the hila in the lateral projection that is not seen frontally. Recommend chest CT in this former smoker. Electronically Signed   By: Monte Fantasia M.D.   On: 10/26/2016 09:28   Ct Angio Chest Pe W And/or Wo Contrast  Result Date: 10/26/2016 CLINICAL DATA:  Shortness of breath, cough and leukocytosis. Lateral chest x-ray demonstrated potential hilar/perihilar mass-like density. EXAM: CT ANGIOGRAPHY CHEST WITH CONTRAST TECHNIQUE: Multidetector CT imaging of the chest was performed using the standard protocol during bolus administration of intravenous contrast. Multiplanar CT image reconstructions and MIPs were obtained to evaluate the vascular anatomy. CONTRAST:  100 mL Isovue 370 IV COMPARISON:  Chest x-ray earlier today. FINDINGS: Cardiovascular: Opacification of the pulmonary artery use is somewhat suboptimal at the peripheral level. However, significant pulmonary embolism can be excluded. The thoracic aorta is normal in caliber and shows no evidence of significant atherosclerosis, aneurysm or dissection. Proximal great vessels are normally patent and show normal branching anatomy. There is evidence of coronary atherosclerosis with calcified plaque present at the level of the left main coronary artery, LAD and left circumflex coronary artery. No pericardial fluid. The heart size is normal. Somewhat thin appearance of the lateral left ventricular myocardium and distal  apex may be consistent with prior myocardial infarction. Mediastinum/Nodes: No enlarged mediastinal, hilar, or axillary lymph nodes. Thyroid gland, trachea, and esophagus demonstrate no significant findings. Lungs/Pleura: There is no evidence of pulmonary edema, consolidation, pneumothorax, nodule or pleural fluid. Upper Abdomen: No acute abnormality. Musculoskeletal: No chest wall abnormality. No acute or significant osseous findings. Review of the MIP images confirms the above findings. IMPRESSION: 1. No evidence of pulmonary embolism or other acute pulmonary process. 2. Coronary atherosclerosis with calcified plaque at the level of the left main, LAD and left circumflex territories. Associated suggestion by CT of lateral myocardial and distal apical thinning which may be consistent with prior infarction. Correlation suggested with any known prior cardiac history. 3. No pulmonary masses or lymphadenopathy. Electronically Signed   By: Aletta Edouard M.D.   On: 10/26/2016 10:44        Scheduled Meds: . aspirin EC  81 mg Oral Daily  . enoxaparin (LOVENOX) injection  40 mg Subcutaneous Q24H  . losartan  100 mg Oral Daily   And  . hydrochlorothiazide  12.5 mg Oral Daily  . [START ON 10/28/2016] Influenza vac split quadrivalent PF  0.5 mL Intramuscular Tomorrow-1000  . pravastatin  40 mg Oral QHS   Continuous Infusions:   LOS: 1 day    Time spent: 35 minutes.     Hosie Poisson, MD [facility] Pager [phone]  If 7PM-7AM, please contact night-coverage [email or web address] Password [email or web address] 10/27/2016, 5:17 PM

## 2016-10-27 NOTE — Progress Notes (Signed)
2D Echocardiogram has been performed.  Philip Reyes 10/27/2016, 12:05 PM

## 2016-10-28 DIAGNOSIS — R0600 Dyspnea, unspecified: Secondary | ICD-10-CM | POA: Diagnosis not present

## 2016-10-28 DIAGNOSIS — R0902 Hypoxemia: Secondary | ICD-10-CM | POA: Diagnosis not present

## 2016-10-28 LAB — CBC WITH DIFFERENTIAL/PLATELET
Basophils Absolute: 0 10*3/uL (ref 0.0–0.1)
Basophils Relative: 0 %
Eosinophils Absolute: 0.4 10*3/uL (ref 0.0–0.7)
Eosinophils Relative: 5 %
HEMATOCRIT: 39.8 % (ref 39.0–52.0)
HEMOGLOBIN: 13.5 g/dL (ref 13.0–17.0)
LYMPHS ABS: 1.5 10*3/uL (ref 0.7–4.0)
LYMPHS PCT: 18 %
MCH: 26.3 pg (ref 26.0–34.0)
MCHC: 33.9 g/dL (ref 30.0–36.0)
MCV: 77.6 fL — ABNORMAL LOW (ref 78.0–100.0)
MONOS PCT: 7 %
Monocytes Absolute: 0.5 10*3/uL (ref 0.1–1.0)
NEUTROS ABS: 5.6 10*3/uL (ref 1.7–7.7)
NEUTROS PCT: 70 %
Platelets: 265 10*3/uL (ref 150–400)
RBC: 5.13 MIL/uL (ref 4.22–5.81)
RDW: 14.3 % (ref 11.5–15.5)
WBC: 8.1 10*3/uL (ref 4.0–10.5)

## 2016-10-28 LAB — BASIC METABOLIC PANEL
Anion gap: 11 (ref 5–15)
BUN: 20 mg/dL (ref 6–20)
CHLORIDE: 96 mmol/L — AB (ref 101–111)
CO2: 27 mmol/L (ref 22–32)
CREATININE: 1.51 mg/dL — AB (ref 0.61–1.24)
Calcium: 9.1 mg/dL (ref 8.9–10.3)
GFR calc non Af Amer: 49 mL/min — ABNORMAL LOW (ref >60)
GFR, EST AFRICAN AMERICAN: 57 mL/min — AB (ref >60)
Glucose, Bld: 303 mg/dL — ABNORMAL HIGH (ref 65–99)
POTASSIUM: 4.1 mmol/L (ref 3.5–5.1)
Sodium: 134 mmol/L — ABNORMAL LOW (ref 135–145)

## 2016-10-28 NOTE — Progress Notes (Signed)
Patient given discharge instructions, and verbalized an understanding of all discharge instructions.  Patient agrees with discharge plan, and is being discharged in stable medical condition.  Patient assisted to front door by nurse tech.

## 2016-10-28 NOTE — Care Management Note (Signed)
Case Management Note  Patient Details  Name: JOSEF TOURIGNY MRN: [id number] Date of Birth: 05-19-1955  Subjective/Objective:                    Action/Plan:d/c home.   Expected Discharge Date:  10/29/16               Expected Discharge Plan:  Home/Self Care  In-House Referral:     Discharge planning Services  CM Consult  Post Acute Care Choice:    Choice offered to:     DME Arranged:    DME Agency:     HH Arranged:    HH Agency:     Status of Service:  Completed, signed off  If discussed at [employer] of Stay Meetings, dates discussed:    Additional Comments:  Dessa Phi, RN 10/28/2016, 9:29 AM

## 2016-10-31 LAB — CULTURE, BLOOD (ROUTINE X 2)
Culture: NO GROWTH
Culture: NO GROWTH
SPECIAL REQUESTS: ADEQUATE
Special Requests: ADEQUATE

## 2016-11-05 NOTE — Discharge Summary (Signed)
Physician Discharge Summary  Philip Reyes:[id number] DOB: Feb 02, 1955 DOA: 10/26/2016  PCP: System, Provider Not In  Admit date: 10/26/2016 Discharge date: 10/28/2016  Admitted From: Home.  Disposition:  Home.   Recommendations for Outpatient Follow-up:  1. Follow up with PCP in 1-2 weeks 2. Please obtain BMP/CBC in one week 3. Please follow up with cardiology, if recurrent symptoms of sob occur.    Discharge Condition:stable.  CODE STATUS: full code.  Diet recommendation: Heart Healthy  Brief/Interim Summary: Philip Evetts Parkeris a 61 y.o.malewith medical history significant of asthma, diabetes, hypertension who is presenting with worsening shortness of breath. He is being admitted for evaluation of SOB.   Discharge Diagnoses:  Active Problems:   Hypoxia  Cough and SOB:  Probably viral in nature.  Improved.  Echocardiogram reviewed and does not show HF.  CTA does not show evidence of PE.  One dose of lasix ordered and his sob improved.  Discharge home and if symptoms reoccur, to follow up with cardiology.     SIRS:  LEUKOCYTOSIS:  Probably viral in nature.  resp panel is negative.  Holding antibiotics for now. Repeat CBC on discharge shows normal wbc count.    Stage 2 CKD: Recommended outpatient follow up with PCP.      Discharge Instructions  Discharge Instructions    Diet - low sodium heart healthy    Complete by:  As directed    Discharge instructions    Complete by:  As directed    Please follow up with PCP in one week.     Allergies as of 10/28/2016   No Known Allergies     Medication List    TAKE these medications   aspirin EC 81 MG tablet Take 81 mg by mouth daily.   glipiZIDE 10 MG 24 hr tablet Commonly known as:  GLUCOTROL XL Take 10 mg by mouth every evening.   guaiFENesin 600 MG 12 hr tablet Commonly known as:  MUCINEX Take 1,200 mg by mouth 2 (two) times daily.   KOMBIGLYZE XR 2.05-998 MG Tb24 Generic drug:   Saxagliptin-Metformin Take 2 tablets by mouth every evening.   losartan-hydrochlorothiazide 100-12.5 MG tablet Commonly known as:  HYZAAR Take 1 tablet by mouth daily.   pravastatin 40 MG tablet Commonly known as:  PRAVACHOL Take 40 mg by mouth at bedtime.       No Known Allergies  Consultations:  None.    Procedures/Studies: Dg Chest 2 View  Result Date: 10/26/2016 CLINICAL DATA:  Cough and congestion for 2 weeks EXAM: CHEST  2 VIEW COMPARISON:  None available FINDINGS: Normal heart size. Masslike density at the level of the hila on the lateral projection that is not seen frontally. There is no edema, consolidation, effusion, or pneumothorax. No acute osseous finding. IMPRESSION: Masslike appearance overlapping the hila in the lateral projection that is not seen frontally. Recommend chest CT in this former smoker. Electronically Signed   By: Monte Fantasia M.D.   On: 10/26/2016 09:28   Ct Angio Chest Pe W And/or Wo Contrast  Result Date: 10/26/2016 CLINICAL DATA:  Shortness of breath, cough and leukocytosis. Lateral chest x-ray demonstrated potential hilar/perihilar mass-like density. EXAM: CT ANGIOGRAPHY CHEST WITH CONTRAST TECHNIQUE: Multidetector CT imaging of the chest was performed using the standard protocol during bolus administration of intravenous contrast. Multiplanar CT image reconstructions and MIPs were obtained to evaluate the vascular anatomy. CONTRAST:  100 mL Isovue 370 IV COMPARISON:  Chest x-ray earlier today. FINDINGS: Cardiovascular: Opacification of the  pulmonary artery use is somewhat suboptimal at the peripheral level. However, significant pulmonary embolism can be excluded. The thoracic aorta is normal in caliber and shows no evidence of significant atherosclerosis, aneurysm or dissection. Proximal great vessels are normally patent and show normal branching anatomy. There is evidence of coronary atherosclerosis with calcified plaque present at the level of the  left main coronary artery, LAD and left circumflex coronary artery. No pericardial fluid. The heart size is normal. Somewhat thin appearance of the lateral left ventricular myocardium and distal apex may be consistent with prior myocardial infarction. Mediastinum/Nodes: No enlarged mediastinal, hilar, or axillary lymph nodes. Thyroid gland, trachea, and esophagus demonstrate no significant findings. Lungs/Pleura: There is no evidence of pulmonary edema, consolidation, pneumothorax, nodule or pleural fluid. Upper Abdomen: No acute abnormality. Musculoskeletal: No chest wall abnormality. No acute or significant osseous findings. Review of the MIP images confirms the above findings. IMPRESSION: 1. No evidence of pulmonary embolism or other acute pulmonary process. 2. Coronary atherosclerosis with calcified plaque at the level of the left main, LAD and left circumflex territories. Associated suggestion by CT of lateral myocardial and distal apical thinning which may be consistent with prior infarction. Correlation suggested with any known prior cardiac history. 3. No pulmonary masses or lymphadenopathy. Electronically Signed   By: Aletta Edouard M.D.   On: 10/26/2016 10:44       Subjective: No new complaints.   Discharge Exam: Vitals:   10/27/16 2148 10/28/16 0546  BP: 124/76 126/80  Pulse: (!) 55 (!) 57  Resp: 20 20  Temp: 99.2 F (37.3 C) 98 F (36.7 C)  SpO2: 97% 98%   Vitals:   10/27/16 0451 10/27/16 1401 10/27/16 2148 10/28/16 0546  BP: 121/68 126/79 124/76 126/80  Pulse: 62 (!) 57 (!) 55 (!) 57  Resp: 20 18 20 20   Temp: 98.4 F (36.9 C) 98.9 F (37.2 C) 99.2 F (37.3 C) 98 F (36.7 C)  TempSrc: Oral Oral Oral Oral  SpO2: 93% 99% 97% 98%  Weight:      Height:        General: Pt is alert, awake, not in acute distress Cardiovascular: RRR, S1/S2 +, no rubs, no gallops Respiratory: CTA bilaterally, no wheezing, no rhonchi Abdominal: Soft, NT, ND, bowel sounds + Extremities: no  edema, no cyanosis    The results of significant diagnostics from this hospitalization (including imaging, microbiology, ancillary and laboratory) are listed below for reference.     Microbiology: Recent Results (from the past 240 hour(s))  Blood Culture (routine x 2)     Status: None   Collection Time: 10/26/16  9:55 AM  Result Value Ref Range Status   Specimen Description BLOOD RIGHT ARM  Final   Special Requests   Final    BOTTLES DRAWN AEROBIC AND ANAEROBIC Blood Culture adequate volume   Culture   Final    NO GROWTH 5 DAYS Performed at [facility], [address], [place], [place] [address]    Report Status 10/31/2016 FINAL  Final  Blood Culture (routine x 2)     Status: None   Collection Time: 10/26/16  9:57 AM  Result Value Ref Range Status   Specimen Description BLOOD LEFT HAND  Final   Special Requests   Final    BOTTLES DRAWN AEROBIC AND ANAEROBIC Blood Culture adequate volume   Culture   Final    NO GROWTH 5 DAYS Performed at [facility], [place] [address], [place], [place] [address]  Report Status 10/31/2016 FINAL  Final  Respiratory Panel by PCR     Status: None   Collection Time: 10/26/16  5:44 PM  Result Value Ref Range Status   Adenovirus NOT DETECTED NOT DETECTED Final   Coronavirus 229E NOT DETECTED NOT DETECTED Final   Coronavirus HKU1 NOT DETECTED NOT DETECTED Final   Coronavirus NL63 NOT DETECTED NOT DETECTED Final   Coronavirus OC43 NOT DETECTED NOT DETECTED Final   Metapneumovirus NOT DETECTED NOT DETECTED Final   Rhinovirus / Enterovirus NOT DETECTED NOT DETECTED Final   Influenza A NOT DETECTED NOT DETECTED Final   Influenza B NOT DETECTED NOT DETECTED Final   Parainfluenza Virus 1 NOT DETECTED NOT DETECTED Final   Parainfluenza Virus 2 NOT DETECTED NOT DETECTED Final   Parainfluenza Virus 3 NOT DETECTED NOT DETECTED Final   Parainfluenza Virus 4 NOT DETECTED NOT DETECTED Final   Respiratory Syncytial Virus NOT DETECTED NOT DETECTED  Final   Bordetella pertussis NOT DETECTED NOT DETECTED Final   Chlamydophila pneumoniae NOT DETECTED NOT DETECTED Final   Mycoplasma pneumoniae NOT DETECTED NOT DETECTED Final    Comment: Performed at [facility], [place] [address] [place], [place] [address]     Labs: BNP (last 3 results)  Recent Labs  10/26/16 0924  BNP 96.7   Basic Metabolic Panel: No results for input(s): NA, K, CL, CO2, GLUCOSE, BUN, CREATININE, CALCIUM, MG, PHOS in the last 168 hours. Liver Function Tests: No results for input(s): AST, ALT, ALKPHOS, BILITOT, PROT, ALBUMIN in the last 168 hours. No results for input(s): LIPASE, AMYLASE in the last 168 hours. No results for input(s): AMMONIA in the last 168 hours. CBC: No results for input(s): WBC, NEUTROABS, HGB, HCT, MCV, PLT in the last 168 hours. Cardiac Enzymes: No results for input(s): CKTOTAL, CKMB, CKMBINDEX, TROPONINI in the last 168 hours. BNP: Invalid input(s): POCBNP CBG: No results for input(s): GLUCAP in the last 168 hours. D-Dimer No results for input(s): DDIMER in the last 72 hours. Hgb A1c No results for input(s): HGBA1C in the last 72 hours. Lipid Profile No results for input(s): CHOL, HDL, LDLCALC, TRIG, CHOLHDL, LDLDIRECT in the last 72 hours. Thyroid function studies No results for input(s): TSH, T4TOTAL, T3FREE, THYROIDAB in the last 72 hours.  Invalid input(s): FREET3 Anemia work up No results for input(s): VITAMINB12, FOLATE, FERRITIN, TIBC, IRON, RETICCTPCT in the last 72 hours. Urinalysis    Component Value Date/Time   COLORURINE YELLOW 10/26/2016 1158   APPEARANCEUR CLEAR 10/26/2016 1158   LABSPEC 1.015 10/26/2016 1158   PHURINE 6.0 10/26/2016 1158   GLUCOSEU NEGATIVE 10/26/2016 1158   HGBUR NEGATIVE 10/26/2016 1158   BILIRUBINUR NEGATIVE 10/26/2016 1158   KETONESUR NEGATIVE 10/26/2016 1158   PROTEINUR NEGATIVE 10/26/2016 1158   NITRITE NEGATIVE 10/26/2016 1158   LEUKOCYTESUR NEGATIVE 10/26/2016 1158   Sepsis  Labs Invalid input(s): PROCALCITONIN,  WBC,  LACTICIDVEN Microbiology Recent Results (from the past 240 hour(s))  Blood Culture (routine x 2)     Status: None   Collection Time: 10/26/16  9:55 AM  Result Value Ref Range Status   Specimen Description BLOOD RIGHT ARM  Final   Special Requests   Final    BOTTLES DRAWN AEROBIC AND ANAEROBIC Blood Culture adequate volume   Culture   Final    NO GROWTH 5 DAYS Performed at [facility], [place] [address] [place], [place] [address]    Report Status 10/31/2016 FINAL  Final  Blood Culture (routine x 2)  Status: None   Collection Time: 10/26/16  9:57 AM  Result Value Ref Range Status   Specimen Description BLOOD LEFT HAND  Final   Special Requests   Final    BOTTLES DRAWN AEROBIC AND ANAEROBIC Blood Culture adequate volume   Culture   Final    NO GROWTH 5 DAYS Performed at [facility], [address], [place], [place] [address]    Report Status 10/31/2016 FINAL  Final  Respiratory Panel by PCR     Status: None   Collection Time: 10/26/16  5:44 PM  Result Value Ref Range Status   Adenovirus NOT DETECTED NOT DETECTED Final   Coronavirus 229E NOT DETECTED NOT DETECTED Final   Coronavirus HKU1 NOT DETECTED NOT DETECTED Final   Coronavirus NL63 NOT DETECTED NOT DETECTED Final   Coronavirus OC43 NOT DETECTED NOT DETECTED Final   Metapneumovirus NOT DETECTED NOT DETECTED Final   Rhinovirus / Enterovirus NOT DETECTED NOT DETECTED Final   Influenza A NOT DETECTED NOT DETECTED Final   Influenza B NOT DETECTED NOT DETECTED Final   Parainfluenza Virus 1 NOT DETECTED NOT DETECTED Final   Parainfluenza Virus 2 NOT DETECTED NOT DETECTED Final   Parainfluenza Virus 3 NOT DETECTED NOT DETECTED Final   Parainfluenza Virus 4 NOT DETECTED NOT DETECTED Final   Respiratory Syncytial Virus NOT DETECTED NOT DETECTED Final   Bordetella pertussis NOT DETECTED NOT DETECTED Final   Chlamydophila pneumoniae NOT DETECTED NOT DETECTED Final    Mycoplasma pneumoniae NOT DETECTED NOT DETECTED Final    Comment: Performed at [facility], [place] [address] [place], [place] [address]     Time coordinating discharge: Over 30 minutes  SIGNED:   Hosie Poisson, MD  [facility] 11/05/2016, 8:16 AM Pager   If 7PM-7AM, please contact night-coverage [email or web address] Password [email or web address]

## 2016-11-11 DIAGNOSIS — D509 Iron deficiency anemia, unspecified: Secondary | ICD-10-CM | POA: Diagnosis not present

## 2016-11-11 DIAGNOSIS — E7800 Pure hypercholesterolemia, unspecified: Secondary | ICD-10-CM | POA: Diagnosis not present

## 2016-11-11 DIAGNOSIS — Z7984 Long term (current) use of oral hypoglycemic drugs: Secondary | ICD-10-CM | POA: Diagnosis not present

## 2016-11-11 DIAGNOSIS — I10 Essential (primary) hypertension: Secondary | ICD-10-CM | POA: Diagnosis not present

## 2016-11-11 DIAGNOSIS — E1165 Type 2 diabetes mellitus with hyperglycemia: Secondary | ICD-10-CM | POA: Diagnosis not present

## 2016-11-19 DIAGNOSIS — N179 Acute kidney failure, unspecified: Secondary | ICD-10-CM | POA: Diagnosis not present

## 2016-12-25 DIAGNOSIS — J069 Acute upper respiratory infection, unspecified: Secondary | ICD-10-CM | POA: Diagnosis not present

## 2017-05-11 DIAGNOSIS — Z23 Encounter for immunization: Secondary | ICD-10-CM | POA: Diagnosis not present

## 2017-05-11 DIAGNOSIS — I10 Essential (primary) hypertension: Secondary | ICD-10-CM | POA: Diagnosis not present

## 2017-05-11 DIAGNOSIS — R7989 Other specified abnormal findings of blood chemistry: Secondary | ICD-10-CM | POA: Diagnosis not present

## 2017-05-11 DIAGNOSIS — E119 Type 2 diabetes mellitus without complications: Secondary | ICD-10-CM | POA: Diagnosis not present

## 2017-05-11 DIAGNOSIS — E7800 Pure hypercholesterolemia, unspecified: Secondary | ICD-10-CM | POA: Diagnosis not present

## 2017-05-11 DIAGNOSIS — D509 Iron deficiency anemia, unspecified: Secondary | ICD-10-CM | POA: Diagnosis not present

## 2017-05-11 DIAGNOSIS — Z0000 Encounter for general adult medical examination without abnormal findings: Secondary | ICD-10-CM | POA: Diagnosis not present

## 2017-11-18 DIAGNOSIS — I10 Essential (primary) hypertension: Secondary | ICD-10-CM | POA: Diagnosis not present

## 2017-11-18 DIAGNOSIS — D509 Iron deficiency anemia, unspecified: Secondary | ICD-10-CM | POA: Diagnosis not present

## 2017-11-18 DIAGNOSIS — E7800 Pure hypercholesterolemia, unspecified: Secondary | ICD-10-CM | POA: Diagnosis not present

## 2017-11-18 DIAGNOSIS — E119 Type 2 diabetes mellitus without complications: Secondary | ICD-10-CM | POA: Diagnosis not present

## 2017-11-20 DIAGNOSIS — H40023 Open angle with borderline findings, high risk, bilateral: Secondary | ICD-10-CM | POA: Diagnosis not present

## 2017-11-20 DIAGNOSIS — E119 Type 2 diabetes mellitus without complications: Secondary | ICD-10-CM | POA: Diagnosis not present

## 2017-11-20 DIAGNOSIS — H2513 Age-related nuclear cataract, bilateral: Secondary | ICD-10-CM | POA: Diagnosis not present

## 2017-12-09 DIAGNOSIS — N183 Chronic kidney disease, stage 3 (moderate): Secondary | ICD-10-CM | POA: Diagnosis not present

## 2018-05-18 DIAGNOSIS — E119 Type 2 diabetes mellitus without complications: Secondary | ICD-10-CM | POA: Diagnosis not present

## 2018-05-18 DIAGNOSIS — I129 Hypertensive chronic kidney disease with stage 1 through stage 4 chronic kidney disease, or unspecified chronic kidney disease: Secondary | ICD-10-CM | POA: Diagnosis not present

## 2018-05-18 DIAGNOSIS — E7800 Pure hypercholesterolemia, unspecified: Secondary | ICD-10-CM | POA: Diagnosis not present

## 2018-05-27 DIAGNOSIS — I129 Hypertensive chronic kidney disease with stage 1 through stage 4 chronic kidney disease, or unspecified chronic kidney disease: Secondary | ICD-10-CM | POA: Diagnosis not present

## 2018-05-27 DIAGNOSIS — E119 Type 2 diabetes mellitus without complications: Secondary | ICD-10-CM | POA: Diagnosis not present

## 2018-05-27 DIAGNOSIS — D509 Iron deficiency anemia, unspecified: Secondary | ICD-10-CM | POA: Diagnosis not present

## 2018-05-27 DIAGNOSIS — E7800 Pure hypercholesterolemia, unspecified: Secondary | ICD-10-CM | POA: Diagnosis not present

## 2019-04-03 IMAGING — CT CT ANGIO CHEST
2 of 8 series · 18 of 36 positions shown · IV contrast (isovue)
Comparison: Chest x-ray earlier today.

CLINICAL DATA: Shortness of breath, cough and leukocytosis. Lateral
chest x-ray demonstrated potential hilar/perihilar mass-like
density.

EXAM:
CT ANGIOGRAPHY CHEST WITH CONTRAST
TECHNIQUE: Multidetector CT imaging of the chest was performed using the
standard protocol during bolus administration of intravenous
contrast. Multiplanar CT image reconstructions and MIPs were
obtained to evaluate the vascular anatomy.
CONTRAST:  100 mL Isovue 370 IV

[Series 6: pe thins · axial · 0.80mm/px · z∈[-334,-38]mm · 17 of 438 slices shown]
[im 22/438  lung]
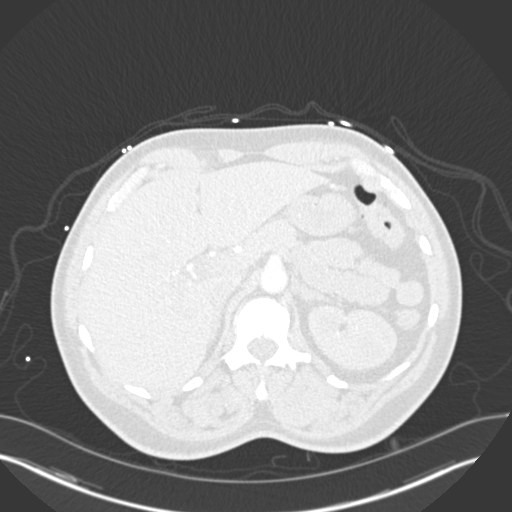
[im 44/438  mediastinal]
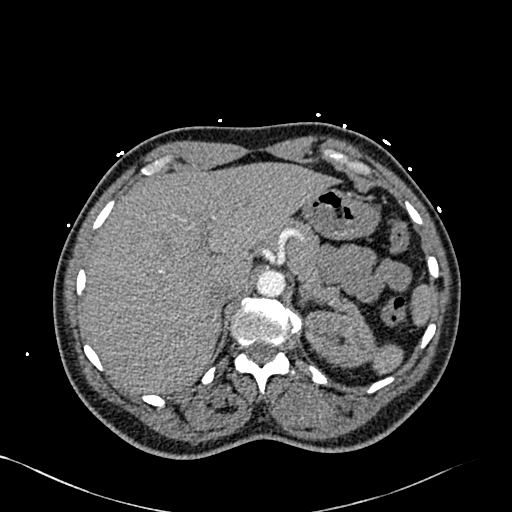
[im 66/438  lung]
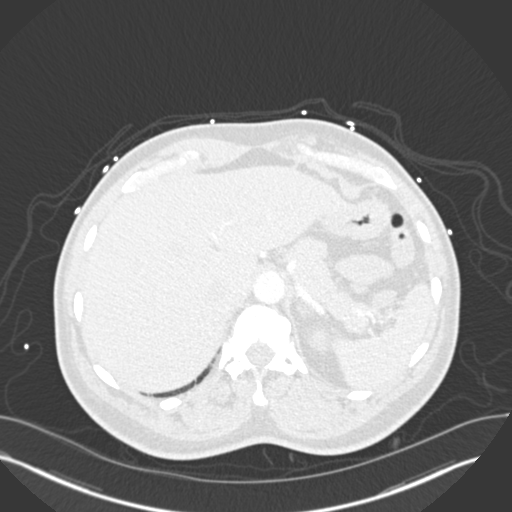
[im 88/438  mediastinal]
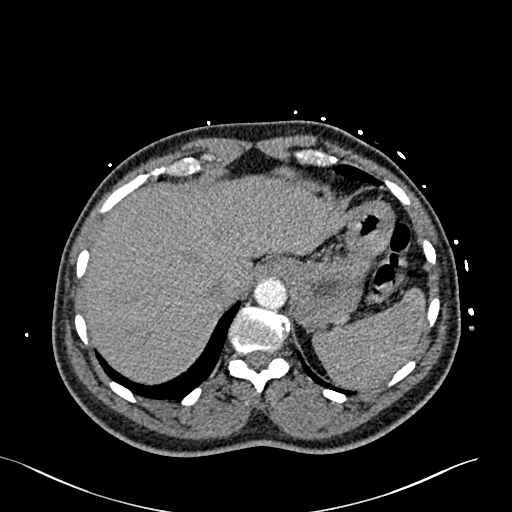
[im 132/438  lung]
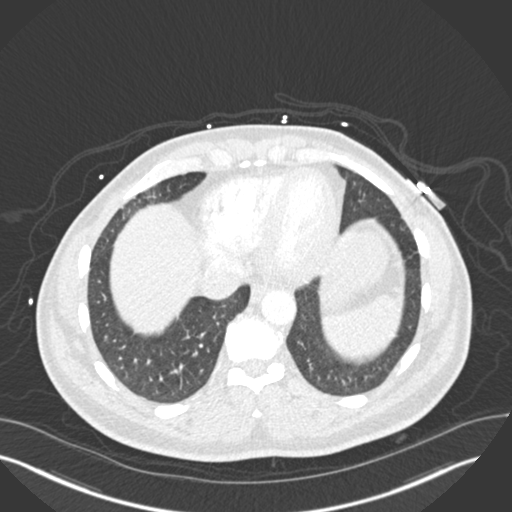
[im 153/438  mediastinal]
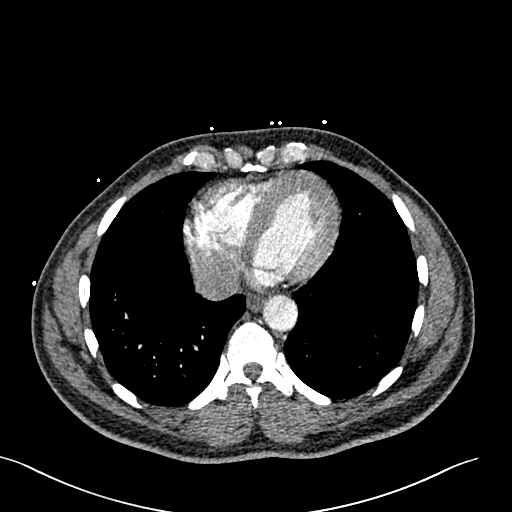
[im 175/438  lung]
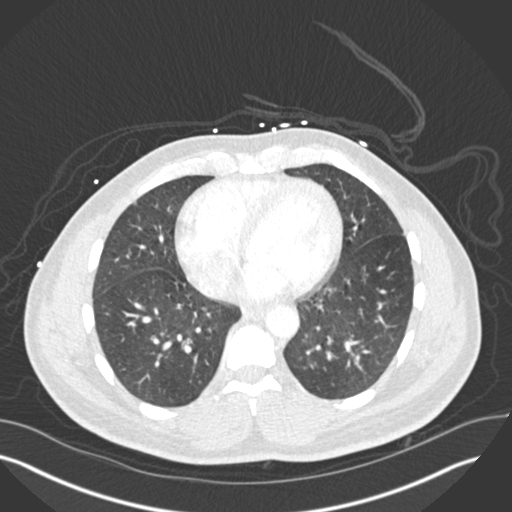
[im 197/438  mediastinal]
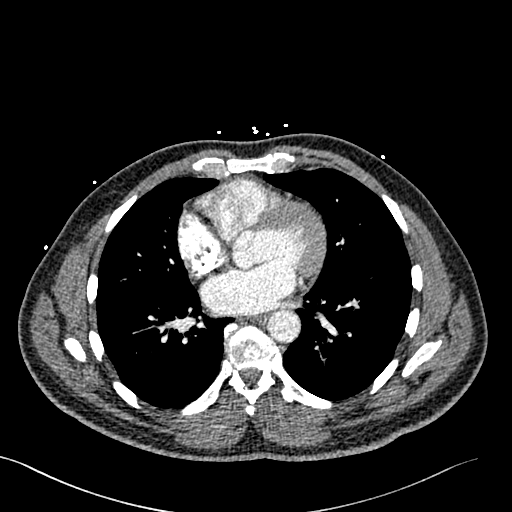
[im 219/438  lung]
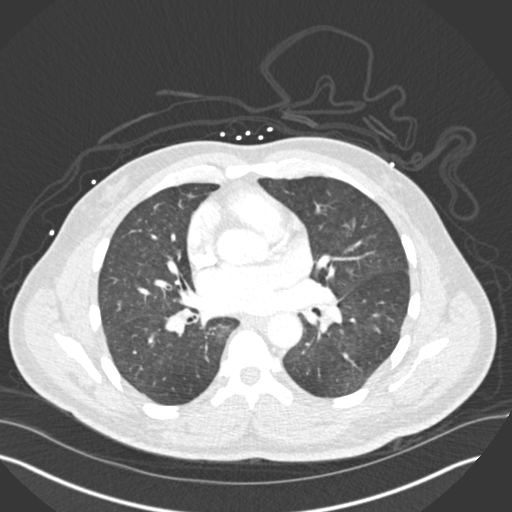
[im 241/438  mediastinal]
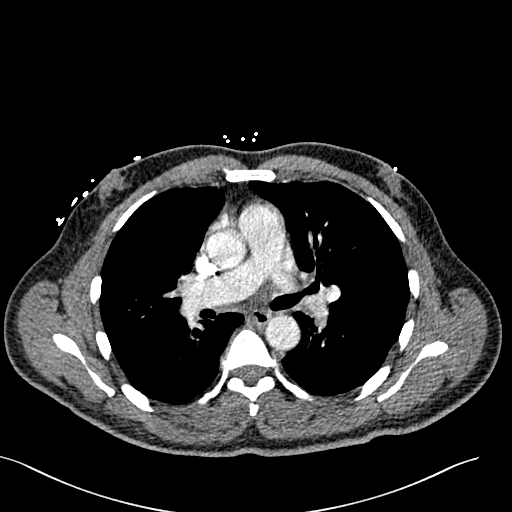
[im 263/438  lung]
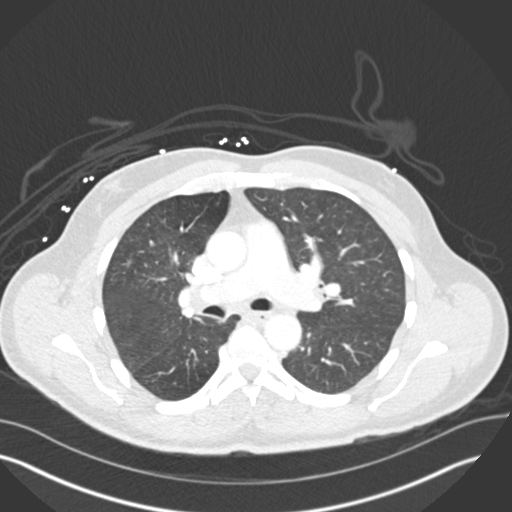
[im 285/438  mediastinal]
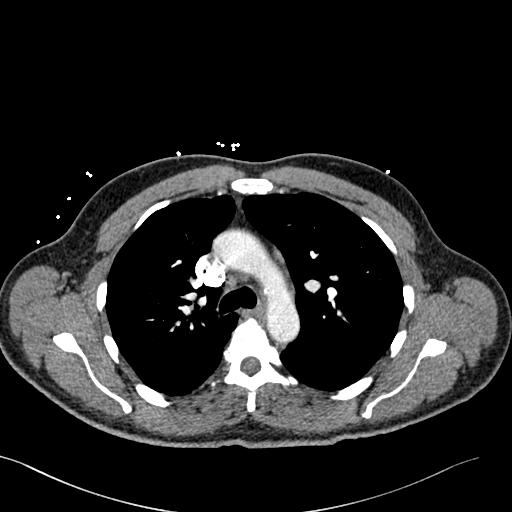
[im 306/438  lung]
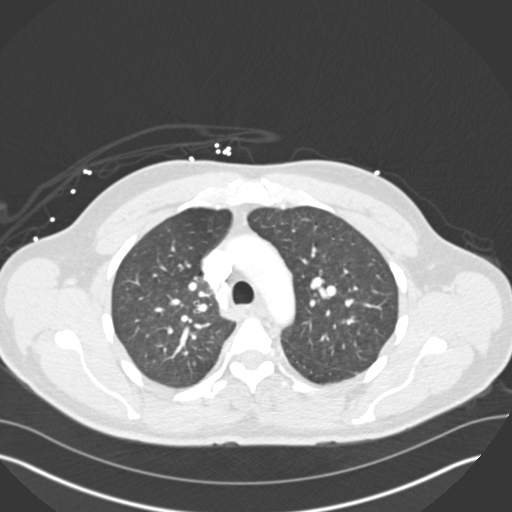
[im 350/438  mediastinal]
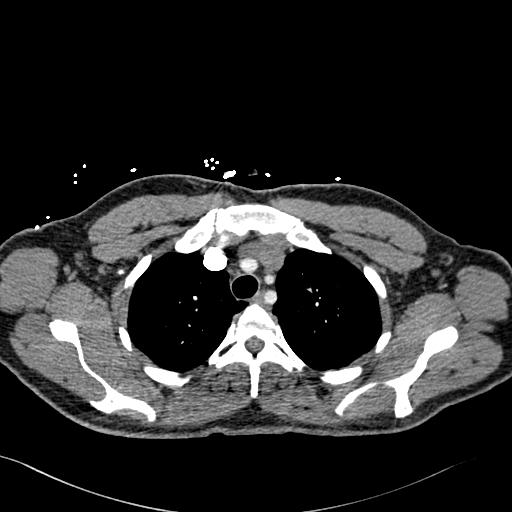
[im 372/438  lung]
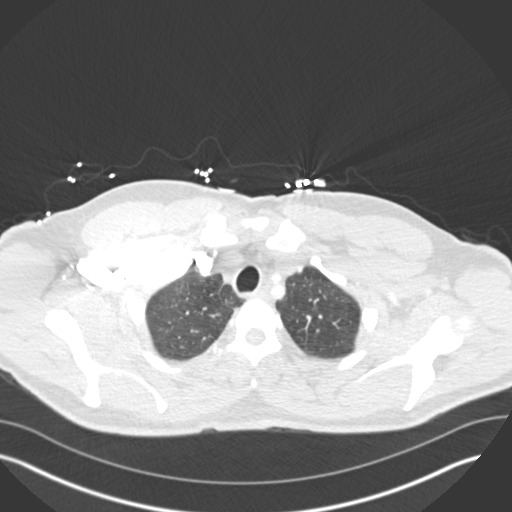
[im 394/438  mediastinal]
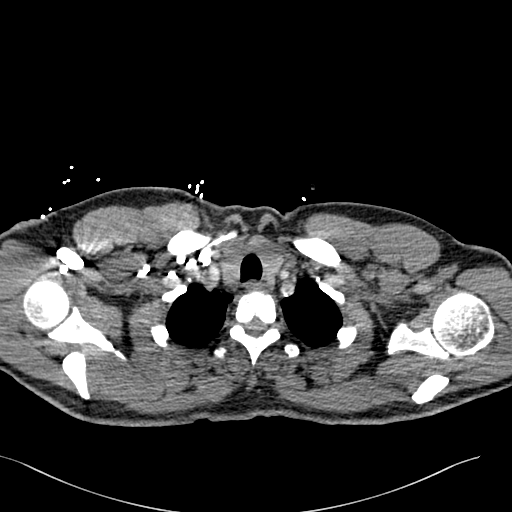
[im 416/438  lung]
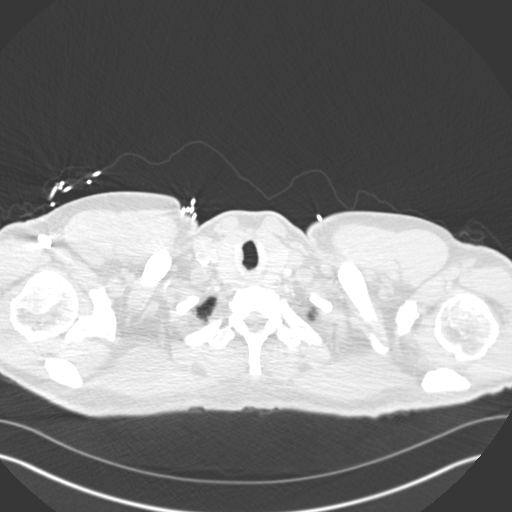

[Series 7: pe coronal mpr · coronal · 0.64mm/px · 1 of 92 slices shown]
[im 46/92  mediastinal]
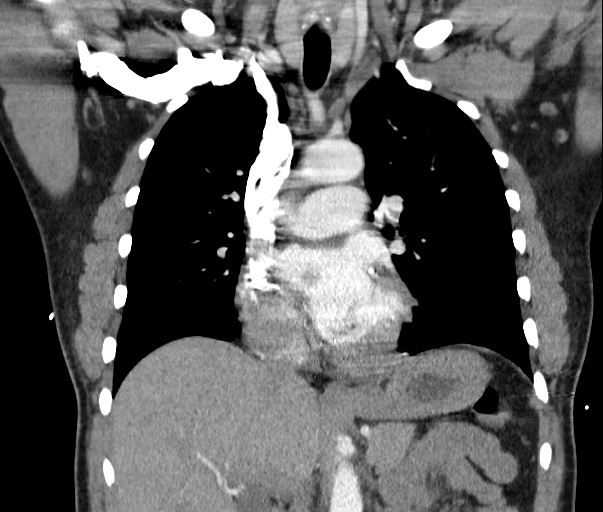

[18 of 36 positions shown; findings below may reference images not displayed]

FINDINGS: Cardiovascular: Opacification of the pulmonary artery use is
somewhat suboptimal at the peripheral level. However, significant
pulmonary embolism can be excluded. The thoracic aorta is normal in
caliber and shows no evidence of significant atherosclerosis,
aneurysm or dissection. Proximal great vessels are normally patent
and show normal branching anatomy. There is evidence of coronary
atherosclerosis with calcified plaque present at the level of the
left main coronary artery, LAD and left circumflex coronary artery.
No pericardial fluid. The heart size is normal. Somewhat thin
appearance of the lateral left ventricular myocardium and distal
apex may be consistent with prior myocardial infarction.

Mediastinum/Nodes: No enlarged mediastinal, hilar, or axillary lymph
nodes. Thyroid gland, trachea, and esophagus demonstrate no
significant findings.

Lungs/Pleura: There is no evidence of pulmonary edema,
consolidation, pneumothorax, nodule or pleural fluid.

Upper Abdomen: No acute abnormality.

Musculoskeletal: No chest wall abnormality. No acute or significant
osseous findings.

Review of the MIP images confirms the above findings.
IMPRESSION: 1. No evidence of pulmonary embolism or other acute pulmonary
process.
2. Coronary atherosclerosis with calcified plaque at the level of
the left main, LAD and left circumflex territories. Associated
suggestion by CT of lateral myocardial and distal apical thinning
which may be consistent with prior infarction. Correlation suggested
with any known prior cardiac history.
3. No pulmonary masses or lymphadenopathy.

## 2020-07-24 ENCOUNTER — Ambulatory Visit: Payer: Self-pay | Attending: Internal Medicine

## 2020-07-24 ENCOUNTER — Other Ambulatory Visit (HOSPITAL_BASED_OUTPATIENT_CLINIC_OR_DEPARTMENT_OTHER): Payer: Self-pay

## 2020-07-24 ENCOUNTER — Other Ambulatory Visit: Payer: Self-pay

## 2020-07-24 DIAGNOSIS — Z23 Encounter for immunization: Secondary | ICD-10-CM

## 2020-07-24 MED ORDER — PFIZER-BIONT COVID-19 VAC-TRIS 30 MCG/0.3ML IM SUSP
INTRAMUSCULAR | 0 refills | Status: AC
  Filled 2020-07-24: qty 0.3, 1d supply, fill #0

## 2020-07-24 NOTE — Progress Notes (Signed)
   Covid-19 Vaccination Clinic  Name:  Philip Reyes    MRN: [id number] DOB: 1955-05-28  07/24/2020  Mr. Philip Reyes was observed post Covid-19 immunization for 15 minutes without incident. He was provided with Vaccine Information Sheet and instruction to access the V-Safe system.   Mr. Philip Reyes was instructed to call 911 with any severe reactions post vaccine: Difficulty breathing  Swelling of face and throat  A fast heartbeat  A bad rash all over body  Dizziness and weakness   Immunizations Administered     Name Date Dose VIS Date Route   PFIZER Comrnaty(Gray TOP) Covid-19 Vaccine 07/24/2020  9:50 AM 0.3 mL 12/22/2019 Intramuscular   Manufacturer: [place]   Lot: [id number]   [place]: [phone]

## 2021-02-05 ENCOUNTER — Ambulatory Visit: Payer: Self-pay | Attending: Internal Medicine

## 2021-02-05 ENCOUNTER — Other Ambulatory Visit (HOSPITAL_BASED_OUTPATIENT_CLINIC_OR_DEPARTMENT_OTHER): Payer: Self-pay

## 2021-02-05 DIAGNOSIS — Z23 Encounter for immunization: Secondary | ICD-10-CM

## 2021-02-05 MED ORDER — PFIZER COVID-19 VAC BIVALENT 30 MCG/0.3ML IM SUSP
INTRAMUSCULAR | 0 refills | Status: AC
  Filled 2021-02-05: qty 0.3, 1d supply, fill #0

## 2021-02-05 NOTE — Progress Notes (Signed)
° °  Covid-19 Vaccination Clinic  Name:  Philip Reyes    MRN: [id number] DOB: Oct 14, 1955  02/05/2021  Mr. Philip Reyes was observed post Covid-19 immunization for 15 minutes without incident. He was provided with Vaccine Information Sheet and instruction to access the V-Safe system.   Mr. Philip Reyes was instructed to call 911 with any severe reactions post vaccine: Difficulty breathing  Swelling of face and throat  A fast heartbeat  A bad rash all over body  Dizziness and weakness   Immunizations Administered     Name Date Dose VIS Date Route   Pfizer Covid-19 Vaccine Bivalent Booster 02/05/2021  9:51 AM 0.3 mL 09/12/2020 Intramuscular   Manufacturer: [place]   Lot: [id number]   Philip Reyes: [phone]

## 2022-07-24 ENCOUNTER — Other Ambulatory Visit (HOSPITAL_COMMUNITY): Payer: Self-pay | Admitting: Family Medicine

## 2022-07-24 DIAGNOSIS — R9431 Abnormal electrocardiogram [ECG] [EKG]: Secondary | ICD-10-CM

## 2022-08-07 ENCOUNTER — Ambulatory Visit (HOSPITAL_COMMUNITY)
Admission: RE | Admit: 2022-08-07 | Discharge: 2022-08-07 | Disposition: A | Discharge status: Home / Self Care | Payer: Self-pay | Source: Ambulatory Visit | Attending: Family Medicine | Admitting: Family Medicine

## 2022-08-07 DIAGNOSIS — R9431 Abnormal electrocardiogram [ECG] [EKG]: Secondary | ICD-10-CM | POA: Insufficient documentation

## 2022-09-02 ENCOUNTER — Encounter (HOSPITAL_BASED_OUTPATIENT_CLINIC_OR_DEPARTMENT_OTHER): Payer: Self-pay | Admitting: Cardiology

## 2022-09-02 ENCOUNTER — Ambulatory Visit (INDEPENDENT_AMBULATORY_CARE_PROVIDER_SITE_OTHER): Payer: Medicare Other | Admitting: Cardiology

## 2022-09-02 VITALS — BP 136/78 | HR 60 | Ht 74.0 in | Wt 212.7 lb

## 2022-09-02 DIAGNOSIS — Z7189 Other specified counseling: Secondary | ICD-10-CM | POA: Diagnosis not present

## 2022-09-02 DIAGNOSIS — Z5181 Encounter for therapeutic drug level monitoring: Secondary | ICD-10-CM

## 2022-09-02 DIAGNOSIS — I10 Essential (primary) hypertension: Secondary | ICD-10-CM | POA: Diagnosis not present

## 2022-09-02 DIAGNOSIS — Z712 Person consulting for explanation of examination or test findings: Secondary | ICD-10-CM

## 2022-09-02 DIAGNOSIS — E7800 Pure hypercholesterolemia, unspecified: Secondary | ICD-10-CM

## 2022-09-02 DIAGNOSIS — E119 Type 2 diabetes mellitus without complications: Secondary | ICD-10-CM

## 2022-09-02 DIAGNOSIS — I2510 Atherosclerotic heart disease of native coronary artery without angina pectoris: Secondary | ICD-10-CM | POA: Diagnosis not present

## 2022-09-02 MED ORDER — ROSUVASTATIN CALCIUM 20 MG PO TABS: 20.0000 mg | ORAL_TABLET | Freq: Every day | ORAL | 3 refills | Status: DC

## 2022-09-02 NOTE — Patient Instructions (Signed)
Medication Instructions:  STOP PRAVASTATIN   START ROSUVASTATIN 20 MG DAILY  *If you need a refill on your cardiac medications before your next appointment, please call your pharmacy*  Lab Work: FASTING LIPIDS IN 3 MONTHS   If you have labs (blood work) drawn today and your tests are completely normal, you will receive your results only by: MyChart Message (if you have MyChart) OR A paper copy in the mail If you have any lab test that is abnormal or we need to change your treatment, we will call you to review the results.  Testing/Procedures: NONE  Follow-Up: At [facility], you and your health needs are our priority.  As part of our continuing mission to provide you with exceptional heart care, we have created designated Provider Care Teams.  These Care Teams include your primary Cardiologist (physician) and Advanced Practice Providers (APPs -  Physician Assistants and Nurse Practitioners) who all work together to provide you with the care you need, when you need it.  We recommend signing up for the patient portal called "MyChart".  Sign up information is provided on this After Visit Summary.  MyChart is used to connect with patients for Virtual Visits (Telemedicine).  Patients are able to view lab/test results, encounter notes, upcoming appointments, etc.  Non-urgent messages can be sent to your provider as well.   To learn more about what you can do with MyChart, go to [email or web address].    Your next appointment:   12 month(s)  Provider:   Jodelle Red, MD or Gillian Shields, NP

## 2022-09-02 NOTE — Progress Notes (Signed)
Cardiology Office Note:  .    Date:  09/02/2022  ID:  Philip Reyes, DOB April 24, 1955, MRN [id number] PCP: System, Provider Not In  [facility] Providers Cardiologist:  Jodelle Red, MD     History of Present Illness: .    Philip Reyes is a 67 y.o. male with a hx of CAD, hyperlipidemia, hypertension, type 2 diabetes, iron deficiency anemia, CKD 3a, here for the evaluation of elevated coronary calcium score.  Referral notes from [facility], [place] personally reviewed. He was seen 07/24/2022 for his annual physical. He had a coronary CT 08/07/2022 revealing a coronary calcium score of 892 and was subsequently referred to cardiology.  Cardiovascular risk factors: Prior clinical ASCVD: None prior to coronary CT 07/2022. Comorbid conditions:  Hyperlipidemia - on pravastatin for a couple years at least. Hypertension - He notes that he was previously on a stronger dose of losartan. This had been discontinued due to worsening kidney function (also on HCTZ at the time). About 2 months ago he was restarted on a lower dose of losartan, and his kidney function remained stable. However, his blood pressure is not yet at goal (148/80 in clinic initially, and 136/78 on manual recheck). He reports most readings at home in the 130s systolic.  Metabolic syndrome/Obesity:  Current weight 212 lbs. Chronic inflammatory conditions: None Tobacco use history: Never Family history: No cardiovascular family history that he knows. Prior pertinent testing and/or incidental findings: Echocardiogram 10/2016. Exercise level: Mowing lawns every week with self-propelled mower. No anginal symptoms. May feel mildly winded with the constant walking. Heart rate elevates as expected and he will rest and hydrate as needed. He has been able to complete 2 yards with resting in between. Lately he has been drinking more water, 80 to 100 oz daily. Has used a rowing machine at home a few times. Works in [employer], Clinical research associate,  Catering manager. Walks his dog twice daily, minimum 3/4 mile. Walks 25-30 miles per week.  Sometimes, he feels like his heart will skip a beat, also describes this as noticing a pause for a moment, then a mildly stronger beat. Typically he feels this in his central chest.  He denies any chest pain, peripheral edema, lightheadedness, headaches, syncope, orthopnea, or PND.  ROS:  Please see the history of present illness. ROS otherwise negative except as noted.  (+) Occasional palpitations (+) Mild exertional shortness of breath  Studies Reviewed: Marland Kitchen    EKG Interpretation Date/Time:  Tuesday September 02 2022 15:55:25 EDT Ventricular Rate:  60 PR Interval:  130 QRS Duration:  104 QT Interval:  404 QTC Calculation: 404 R Axis:   65  Text Interpretation: Normal sinus rhythm Minimal voltage criteria for LVH, may be normal variant Nonspecific T wave abnormality Confirmed by Jodelle Red [phone]) on 09/02/2022 4:13:31 PM    CT Cardiac Scoring  08/07/2022: IMPRESSION: Coronary calcium score of 892. This was 91st percentile for age-, race-, and sex-matched controls.  Physical Exam:    VS:  BP 136/78 (BP Location: Right Arm, Patient Position: Sitting, Cuff Size: Normal)   Pulse 60   Ht 6\' 2"  (1.88 m)   Wt 212 lb 11.2 oz (96.5 kg)   BMI 27.31 kg/m    Wt Readings from Last 3 Encounters:  09/02/22 212 lb 11.2 oz (96.5 kg)  10/26/16 202 lb 13.2 oz (92 kg)    GEN: Well nourished, well developed in no acute distress HEENT: Normal, moist mucous membranes NECK: No JVD CARDIAC: regular rhythm with one  PVC, normal S1 and S2, no rubs or gallops. No murmur. VASCULAR: Radial and DP pulses 2+ bilaterally. No carotid bruits RESPIRATORY:  Clear to auscultation without rales, wheezing or rhonchi  ABDOMEN: Soft, non-tender, non-distended MUSCULOSKELETAL:  Ambulates independently SKIN: Warm and dry, no edema NEUROLOGIC:  Alert and oriented x 3. No focal neuro deficits noted. PSYCHIATRIC:  Normal affect    ASSESSMENT AND PLAN: .    Coronary artery calcification, consistent with nonobstructive CAD Hypercholesterolemia Type II diabetes -Ca score 892, 91st%. Reviewed test results at length today. -Change pravastatin to rosuvastatin, goal LDL <55. Recheck lipids in 3 mos -discussed exercise treadmill stress, but as he is active at baseline and has diabetes, would need exercise nuclear perfusion study. After shared decision making, will treat aggressively and monitor symptoms -reviewed red flag warning signs that need immediate medical attention -with type II diabetes, would consider SGLT2i and/or GLP1RA to reduce MACE given his ASCVD. Could replace saxagliptin or glipizide. He will discuss with his PCP  Hypertension -control better but not at goal.  -on losartan 25 mg daily. If renal function stable and BP remains >130/80, would consider increasing to 50 mg at follow up with his PCP  CV risk counseling and prevention -recommend heart healthy/Mediterranean diet, with whole grains, fruits, vegetable, fish, lean meats, nuts, and olive oil. Limit salt. -recommend moderate walking, 3-5 times/week for 30-50 minutes each session. Aim for at least 150 minutes.week. Goal should be pace of 3 miles/hours, or walking 1.5 miles in 30 minutes -recommend avoidance of tobacco products. Avoid excess alcohol.  Dispo: Follow-up in 1 year, or sooner as needed.  I,Mathew Stumpf,acting as a Neurosurgeon for [employer], MD.,have documented all relevant documentation on the behalf of Jodelle Red, MD,as directed by  Jodelle Red, MD while in the presence of Jodelle Red, MD.  I, Jodelle Red, MD, have reviewed all documentation for this visit. The documentation on 10/05/22 for the exam, diagnosis, procedures, and orders are all accurate and complete.   Signed, Jodelle Red, MD

## 2022-12-04 LAB — LIPID PANEL
Chol/HDL Ratio: 2.8 {ratio} (ref 0.0–5.0)
Cholesterol, Total: 128 mg/dL (ref 100–199)
HDL: 46 mg/dL (ref >39)
LDL Chol Calc (NIH): 72 mg/dL (ref 0–99)
Triglycerides: 42 mg/dL (ref 0–149)
VLDL Cholesterol Cal: 10 mg/dL (ref 5–40)

## 2022-12-24 ENCOUNTER — Other Ambulatory Visit (HOSPITAL_BASED_OUTPATIENT_CLINIC_OR_DEPARTMENT_OTHER): Payer: Self-pay | Admitting: *Deleted

## 2022-12-24 DIAGNOSIS — E785 Hyperlipidemia, unspecified: Secondary | ICD-10-CM

## 2022-12-24 DIAGNOSIS — I2510 Atherosclerotic heart disease of native coronary artery without angina pectoris: Secondary | ICD-10-CM

## 2022-12-24 DIAGNOSIS — Z7189 Other specified counseling: Secondary | ICD-10-CM

## 2022-12-24 MED ORDER — EZETIMIBE 10 MG PO TABS: 10.0000 mg | ORAL_TABLET | Freq: Every day | ORAL | 11 refills | Status: DC

## 2023-03-11 LAB — LIPID PANEL
Chol/HDL Ratio: 2 ratio (ref 0.0–5.0)
Cholesterol, Total: 94 mg/dL — ABNORMAL LOW (ref 100–199)
HDL: 46 mg/dL (ref >39)
LDL Chol Calc (NIH): 34 mg/dL (ref 0–99)
Triglycerides: 59 mg/dL (ref 0–149)
VLDL Cholesterol Cal: 14 mg/dL (ref 5–40)

## 2023-03-11 LAB — ALT: ALT: 25 IU/L (ref 0–44)

## 2023-03-19 ENCOUNTER — Other Ambulatory Visit: Payer: Self-pay

## 2023-03-19 MED ORDER — EZETIMIBE 10 MG PO TABS: 10.0000 mg | ORAL_TABLET | Freq: Every day | ORAL | 1 refills | Status: DC

## 2023-03-26 ENCOUNTER — Telehealth (HOSPITAL_BASED_OUTPATIENT_CLINIC_OR_DEPARTMENT_OTHER): Payer: Self-pay

## 2023-03-26 NOTE — Telephone Encounter (Addendum)
 Called results to patient and left results on VM (ok per DPR), instructions left to call office back if patient has any questions!    ----- Message from Alver Sorrow sent at 03/25/2023  1:36 PM EDT ----- Cholesterol at goal. Normal liver. Good result!

## 2023-04-03 NOTE — Telephone Encounter (Signed)
 Pt returning call in regard to results he would like a c/b. Please advise

## 2023-04-03 NOTE — Telephone Encounter (Signed)
 Reviewed labs with patient, pt verbalized understanding.

## 2023-06-22 DIAGNOSIS — N1831 Chronic kidney disease, stage 3a: Secondary | ICD-10-CM | POA: Diagnosis not present

## 2023-06-22 DIAGNOSIS — I2510 Atherosclerotic heart disease of native coronary artery without angina pectoris: Secondary | ICD-10-CM | POA: Diagnosis not present

## 2023-06-22 DIAGNOSIS — E1122 Type 2 diabetes mellitus with diabetic chronic kidney disease: Secondary | ICD-10-CM | POA: Diagnosis not present

## 2023-07-13 DIAGNOSIS — E1122 Type 2 diabetes mellitus with diabetic chronic kidney disease: Secondary | ICD-10-CM | POA: Diagnosis not present

## 2023-07-13 DIAGNOSIS — I2510 Atherosclerotic heart disease of native coronary artery without angina pectoris: Secondary | ICD-10-CM | POA: Diagnosis not present

## 2023-07-13 DIAGNOSIS — N1831 Chronic kidney disease, stage 3a: Secondary | ICD-10-CM | POA: Diagnosis not present

## 2023-07-13 DIAGNOSIS — E7800 Pure hypercholesterolemia, unspecified: Secondary | ICD-10-CM | POA: Diagnosis not present

## 2023-07-19 ENCOUNTER — Other Ambulatory Visit (HOSPITAL_BASED_OUTPATIENT_CLINIC_OR_DEPARTMENT_OTHER): Payer: Self-pay | Admitting: Cardiology

## 2023-07-20 ENCOUNTER — Other Ambulatory Visit: Payer: Self-pay | Admitting: Cardiology

## 2023-07-21 DIAGNOSIS — N1831 Chronic kidney disease, stage 3a: Secondary | ICD-10-CM | POA: Diagnosis not present

## 2023-07-21 DIAGNOSIS — I2510 Atherosclerotic heart disease of native coronary artery without angina pectoris: Secondary | ICD-10-CM | POA: Diagnosis not present

## 2023-07-21 DIAGNOSIS — E1122 Type 2 diabetes mellitus with diabetic chronic kidney disease: Secondary | ICD-10-CM | POA: Diagnosis not present

## 2023-07-27 DIAGNOSIS — H40023 Open angle with borderline findings, high risk, bilateral: Secondary | ICD-10-CM | POA: Diagnosis not present

## 2023-07-27 DIAGNOSIS — H25813 Combined forms of age-related cataract, bilateral: Secondary | ICD-10-CM | POA: Diagnosis not present

## 2023-07-27 DIAGNOSIS — E119 Type 2 diabetes mellitus without complications: Secondary | ICD-10-CM | POA: Diagnosis not present

## 2023-08-12 DIAGNOSIS — M545 Low back pain, unspecified: Secondary | ICD-10-CM | POA: Diagnosis not present

## 2023-08-12 DIAGNOSIS — E1122 Type 2 diabetes mellitus with diabetic chronic kidney disease: Secondary | ICD-10-CM | POA: Diagnosis not present

## 2023-08-12 DIAGNOSIS — Z1389 Encounter for screening for other disorder: Secondary | ICD-10-CM | POA: Diagnosis not present

## 2023-08-12 DIAGNOSIS — Z0000 Encounter for general adult medical examination without abnormal findings: Secondary | ICD-10-CM | POA: Diagnosis not present

## 2023-08-12 DIAGNOSIS — R030 Elevated blood-pressure reading, without diagnosis of hypertension: Secondary | ICD-10-CM | POA: Diagnosis not present

## 2023-08-12 DIAGNOSIS — E7800 Pure hypercholesterolemia, unspecified: Secondary | ICD-10-CM | POA: Diagnosis not present

## 2023-08-12 DIAGNOSIS — D509 Iron deficiency anemia, unspecified: Secondary | ICD-10-CM | POA: Diagnosis not present

## 2023-08-12 DIAGNOSIS — N1831 Chronic kidney disease, stage 3a: Secondary | ICD-10-CM | POA: Diagnosis not present

## 2023-08-13 DIAGNOSIS — E7800 Pure hypercholesterolemia, unspecified: Secondary | ICD-10-CM | POA: Diagnosis not present

## 2023-08-13 DIAGNOSIS — I2510 Atherosclerotic heart disease of native coronary artery without angina pectoris: Secondary | ICD-10-CM | POA: Diagnosis not present

## 2023-08-13 DIAGNOSIS — E1122 Type 2 diabetes mellitus with diabetic chronic kidney disease: Secondary | ICD-10-CM | POA: Diagnosis not present

## 2023-08-13 DIAGNOSIS — N1831 Chronic kidney disease, stage 3a: Secondary | ICD-10-CM | POA: Diagnosis not present

## 2023-08-20 DIAGNOSIS — I2510 Atherosclerotic heart disease of native coronary artery without angina pectoris: Secondary | ICD-10-CM | POA: Diagnosis not present

## 2023-08-20 DIAGNOSIS — E1122 Type 2 diabetes mellitus with diabetic chronic kidney disease: Secondary | ICD-10-CM | POA: Diagnosis not present

## 2023-08-20 DIAGNOSIS — N1831 Chronic kidney disease, stage 3a: Secondary | ICD-10-CM | POA: Diagnosis not present

## 2023-09-13 DIAGNOSIS — E1122 Type 2 diabetes mellitus with diabetic chronic kidney disease: Secondary | ICD-10-CM | POA: Diagnosis not present

## 2023-09-13 DIAGNOSIS — N1831 Chronic kidney disease, stage 3a: Secondary | ICD-10-CM | POA: Diagnosis not present

## 2023-09-13 DIAGNOSIS — E7800 Pure hypercholesterolemia, unspecified: Secondary | ICD-10-CM | POA: Diagnosis not present

## 2023-09-13 DIAGNOSIS — I2510 Atherosclerotic heart disease of native coronary artery without angina pectoris: Secondary | ICD-10-CM | POA: Diagnosis not present

## 2023-09-19 DIAGNOSIS — N1831 Chronic kidney disease, stage 3a: Secondary | ICD-10-CM | POA: Diagnosis not present

## 2023-09-19 DIAGNOSIS — E1122 Type 2 diabetes mellitus with diabetic chronic kidney disease: Secondary | ICD-10-CM | POA: Diagnosis not present

## 2023-09-19 DIAGNOSIS — I2510 Atherosclerotic heart disease of native coronary artery without angina pectoris: Secondary | ICD-10-CM | POA: Diagnosis not present

## 2023-10-13 DIAGNOSIS — N1831 Chronic kidney disease, stage 3a: Secondary | ICD-10-CM | POA: Diagnosis not present

## 2023-10-13 DIAGNOSIS — E7800 Pure hypercholesterolemia, unspecified: Secondary | ICD-10-CM | POA: Diagnosis not present

## 2023-10-13 DIAGNOSIS — I2510 Atherosclerotic heart disease of native coronary artery without angina pectoris: Secondary | ICD-10-CM | POA: Diagnosis not present

## 2023-10-13 DIAGNOSIS — E1122 Type 2 diabetes mellitus with diabetic chronic kidney disease: Secondary | ICD-10-CM | POA: Diagnosis not present

## 2023-11-11 ENCOUNTER — Other Ambulatory Visit (HOSPITAL_BASED_OUTPATIENT_CLINIC_OR_DEPARTMENT_OTHER): Payer: Self-pay

## 2023-11-11 MED ORDER — COMIRNATY 30 MCG/0.3ML IM SUSY
0.3000 mL | PREFILLED_SYRINGE | Freq: Once | INTRAMUSCULAR | 0 refills | Status: AC
  Filled 2023-11-11: qty 0.3, 1d supply, fill #0
